# Patient Record
Sex: Female | Born: 2000 | Race: Black or African American | Hispanic: No | Marital: Single | Smoking: Never smoker
Health system: Southern US, Community
[De-identification: ages and names within clinical notes are randomized; demographics above are authoritative.]

---
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
## 2018-08-18 ENCOUNTER — Other Ambulatory Visit: Payer: Self-pay

## 2018-08-18 DIAGNOSIS — Z20828 Contact with and (suspected) exposure to other viral communicable diseases: Secondary | ICD-10-CM | POA: Insufficient documentation

## 2018-08-18 DIAGNOSIS — J029 Acute pharyngitis, unspecified: Secondary | ICD-10-CM | POA: Insufficient documentation

## 2018-08-18 LAB — GROUP A STREP BY PCR: Group A Strep by PCR: NOT DETECTED

## 2018-08-18 MED ORDER — ACETAMINOPHEN 325 MG PO TABS
650.0000 mg | ORAL_TABLET | Freq: Once | ORAL | Status: AC | PRN
  Administered 2018-08-18: 21:00:00 650 mg via ORAL

## 2018-08-18 MED ORDER — ACETAMINOPHEN 325 MG PO TABS
ORAL_TABLET | ORAL | Status: AC
  Filled 2018-08-18: qty 2

## 2018-08-18 NOTE — ED Triage Notes (Signed)
Pt states sore throat that began today. Pt states "I can't swallow". Pt is able to swallow saliva in triage. Pt with red tonsils noted and hoarse voice. Pt denies other COVID symptoms.

## 2018-08-19 ENCOUNTER — Emergency Department
Admission: EM | Admit: 2018-08-19 | Discharge: 2018-08-19 | Disposition: A | Discharge status: Home / Self Care | Payer: Self-pay | Attending: Emergency Medicine | Admitting: Emergency Medicine

## 2018-08-19 DIAGNOSIS — J029 Acute pharyngitis, unspecified: Secondary | ICD-10-CM

## 2018-08-19 LAB — MONONUCLEOSIS SCREEN: Mono Screen: NEGATIVE

## 2018-08-19 MED ORDER — LIDOCAINE VISCOUS HCL 2 % MT SOLN: 5.0000 mL | Freq: Four times a day (QID) | OROMUCOSAL | 0 refills | Status: DC | PRN

## 2018-08-19 MED ORDER — LIDOCAINE VISCOUS HCL 2 % MT SOLN
15.0000 mL | Freq: Once | OROMUCOSAL | Status: AC
  Administered 2018-08-19: 15 mL via OROMUCOSAL
  Filled 2018-08-19: qty 15

## 2018-08-19 MED ORDER — DEXAMETHASONE SODIUM PHOSPHATE 10 MG/ML IJ SOLN
10.0000 mg | Freq: Once | INTRAMUSCULAR | Status: AC
  Administered 2018-08-19: 10 mg via INTRAMUSCULAR
  Filled 2018-08-19: qty 1

## 2018-08-19 MED ORDER — METHYLPREDNISOLONE 4 MG PO TBPK: ORAL_TABLET | ORAL | 0 refills | Status: DC

## 2018-08-19 MED ORDER — DIPHENHYDRAMINE HCL 12.5 MG/5ML PO ELIX
12.5000 mg | ORAL_SOLUTION | Freq: Once | ORAL | Status: AC
  Administered 2018-08-19: 12.5 mg via ORAL
  Filled 2018-08-19: qty 5

## 2018-08-19 NOTE — ED Provider Notes (Signed)
[facility] Emergency Department Provider Note   ____________________________________________   First MD Initiated Contact with Patient 08/19/18 0701     (approximate)  I have reviewed the triage vital signs and the nursing notes.   HISTORY  Chief Complaint Sore Throat    HPI Michelle Monroe is a 18 y.o. female patient presents with sore throat that began earlier today.  Patient states she cannot swallow.  Further history states patient can tolerate fluids.  Patient also has decreased voice volume.  Patient denies URI signs symptoms.  Patient rates the pain as a 10/10.  Patient described pain as "sore".  No palliative measure for complaint.         No past medical history on file.  There are no active problems to display for this patient.     Prior to Admission medications   Medication Sig Start Date End Date Taking? Authorizing Provider  lidocaine (XYLOCAINE) 2 % solution Use as directed 5 mLs in the mouth or throat every 6 (six) hours as needed for mouth pain. 08/19/18   Sable Feil, PA-C  methylPREDNISolone (MEDROL DOSEPAK) 4 MG TBPK tablet Take Tapered dose as directed 08/19/18   Sable Feil, PA-C    Allergies Patient has no known allergies.  No family history on file.  Social History Social History   Tobacco Use  . Smoking status: Not on file  Substance Use Topics  . Alcohol use: Not on file  . Drug use: Not on file    Review of Systems  Constitutional: No fever/chills Eyes: No visual changes. ENT: Sore throat.   Cardiovascular: Denies chest pain. Respiratory: Denies shortness of breath. Gastrointestinal: No abdominal pain.  No nausea, no vomiting.  No diarrhea.  No constipation. Genitourinary: Negative for dysuria. Musculoskeletal: Negative for back pain. Skin: Negative for rash. Neurological: Negative for headaches, focal weakness or numbness.   ____________________________________________   PHYSICAL EXAM:   VITAL SIGNS: ED Triage Vitals  Enc Vitals Group     BP 08/18/18 2122 100/60     Pulse Rate 08/18/18 2122 (!) 102     Resp 08/18/18 2122 20     Temp 08/18/18 2122 (!) 101.7 F (38.7 C)     Temp Source 08/18/18 2122 Oral     SpO2 08/18/18 2122 100 %     Weight 08/18/18 2123 220 lb (99.8 kg)     Height 08/18/18 2123 5\' 2"  (1.575 m)     Head Circumference --      Peak Flow --      Pain Score 08/18/18 2123 10     Pain Loc --      Pain Edu? --      Excl. in [place]? --     Constitutional: Alert and oriented. Well appearing and in no acute distress. Nose: No congestion/rhinnorhea. Mouth/Throat: Mucous membranes are moist.  Oropharynx erythematous.  Edematous tonsils.  No visible exudate. Neck: No stridor.   Hematological/Lymphatic/Immunilogical: No cervical lymphadenopathy. Cardiovascular: Normal rate, regular rhythm. Grossly normal heart sounds.  Good peripheral circulation. Respiratory: Normal respiratory effort.  No retractions. Lungs CTAB. Gastrointestinal: Soft and nontender. No distention. No abdominal bruits. No CVA tenderness. Musculoskeletal: No lower extremity tenderness nor edema.  No joint effusions. Neurologic:  Normal speech and language. No gross focal neurologic deficits are appreciated. No gait instability. Skin:  Skin is warm, dry and intact. No rash noted. Psychiatric: Mood and affect are normal. Speech and behavior are normal.  ____________________________________________   LABS (all  labs ordered are listed, but only abnormal results are displayed)  Labs Reviewed  GROUP A STREP BY PCR  NOVEL CORONAVIRUS, NAA (HOSPITAL ORDER, SEND-OUT TO REF LAB)  MONONUCLEOSIS SCREEN   ____________________________________________  EKG   ____________________________________________  RADIOLOGY  ED MD interpretation:    Official radiology report(s): No results found.  ____________________________________________   PROCEDURES  Procedure(s) performed (including  Critical Care):  Procedures   ____________________________________________   INITIAL IMPRESSION / ASSESSMENT AND PLAN / ED COURSE  As part of my medical decision making, I reviewed the following data within the electronic MEDICAL RECORD NUMBER         Patient presents with acute onset of sore throat.  Differential consist of strep pharyngitis, viral pharyngitis, mononucleosis.  Discussed negative strep results and mono test with patient.  Patient given discharge care instructions and advised take medication as directed.  Patient will be advised to follow coronavirus test is positive.      ____________________________________________   FINAL CLINICAL IMPRESSION(S) / ED DIAGNOSES  Final diagnoses:  Viral pharyngitis     ED Discharge Orders         Ordered    methylPREDNISolone (MEDROL DOSEPAK) 4 MG TBPK tablet     08/19/18 0711    lidocaine (XYLOCAINE) 2 % solution  Every 6 hours PRN     08/19/18 [id number]           Note:  This document was prepared using Dragon voice recognition software and may include unintentional dictation errors.    Joni ReiningSmith, Ronald K, PA-C 08/19/18 [id number]    Jene EveryKinner, Robert, MD 08/19/18 [phone]

## 2018-08-19 NOTE — ED Notes (Signed)
Pt not in lobby.  

## 2018-08-19 NOTE — ED Notes (Signed)
Pt sleeping in lobby 

## 2018-08-19 NOTE — ED Notes (Signed)
Pt up to restroom.

## 2018-08-19 NOTE — ED Notes (Signed)
Pt sleeping in lobby, will obtain COVID and mono screen.

## 2018-08-19 NOTE — ED Notes (Signed)
No answer in lobby.

## 2018-08-20 LAB — NOVEL CORONAVIRUS, NAA (HOSP ORDER, SEND-OUT TO REF LAB; TAT 18-24 HRS): SARS-CoV-2, NAA: NOT DETECTED

## 2018-08-22 ENCOUNTER — Telehealth: Payer: Self-pay | Admitting: Emergency Medicine

## 2018-08-22 NOTE — Telephone Encounter (Signed)
Called patient and informed of negative covid 19 test.  She needs it to return to work.  She would like the result emailed to [email or web address] which I will do.

## 2018-10-04 ENCOUNTER — Other Ambulatory Visit: Payer: Self-pay

## 2018-10-04 ENCOUNTER — Encounter: Payer: Self-pay | Admitting: Emergency Medicine

## 2018-10-04 ENCOUNTER — Emergency Department: Payer: Self-pay

## 2018-10-04 ENCOUNTER — Emergency Department
Admission: EM | Admit: 2018-10-04 | Discharge: 2018-10-04 | Disposition: A | Discharge status: Home / Self Care | Payer: Self-pay | Attending: Student in an Organized Health Care Education/Training Program | Admitting: Student in an Organized Health Care Education/Training Program

## 2018-10-04 DIAGNOSIS — B9789 Other viral agents as the cause of diseases classified elsewhere: Secondary | ICD-10-CM | POA: Insufficient documentation

## 2018-10-04 DIAGNOSIS — J069 Acute upper respiratory infection, unspecified: Secondary | ICD-10-CM | POA: Insufficient documentation

## 2018-10-04 MED ORDER — BENZONATATE 100 MG PO CAPS: 200.0000 mg | ORAL_CAPSULE | Freq: Three times a day (TID) | ORAL | 0 refills | Status: AC | PRN

## 2018-10-04 MED ORDER — IBUPROFEN 800 MG PO TABS: 800.0000 mg | ORAL_TABLET | Freq: Three times a day (TID) | ORAL | 0 refills | Status: DC | PRN

## 2018-10-04 MED ORDER — FEXOFENADINE-PSEUDOEPHED ER 60-120 MG PO TB12: 1.0000 | ORAL_TABLET | Freq: Two times a day (BID) | ORAL | 0 refills | Status: DC

## 2018-10-04 NOTE — ED Notes (Signed)
Pt ambulatory to xray.

## 2018-10-04 NOTE — ED Provider Notes (Signed)
[facility] Emergency Department Provider Note   ____________________________________________   First MD Initiated Contact with Patient 10/04/18 0809     (approximate)  I have reviewed the triage vital signs and the nursing notes.   HISTORY  Chief Complaint Cough    HPI Michelle Monroe is a 18 y.o. female patient presents with productive cough, chest congestion, and fever started yesterday.  Patient denies dyspnea.  Patient denies nausea, vomiting, diarrhea.  Patient denies recent travel.  Patient denies past exposure to human diagnosis COVID-19.  No palliative measure for complaint.         History reviewed. No pertinent past medical history.  There are no active problems to display for this patient.   History reviewed. No pertinent surgical history.  Prior to Admission medications   Medication Sig Start Date End Date Taking? Authorizing Provider  benzonatate (TESSALON PERLES) 100 MG capsule Take 2 capsules (200 mg total) by mouth 3 (three) times daily as needed. 10/04/18 10/04/19  Sable Feil, PA-C  fexofenadine-pseudoephedrine (ALLEGRA-D) 60-120 MG 12 hr tablet Take 1 tablet by mouth 2 (two) times daily. 10/04/18   Sable Feil, PA-C  ibuprofen (ADVIL) 800 MG tablet Take 1 tablet (800 mg total) by mouth every 8 (eight) hours as needed for moderate pain. 10/04/18   Sable Feil, PA-C  lidocaine (XYLOCAINE) 2 % solution Use as directed 5 mLs in the mouth or throat every 6 (six) hours as needed for mouth pain. 08/19/18   Sable Feil, PA-C  methylPREDNISolone (MEDROL DOSEPAK) 4 MG TBPK tablet Take Tapered dose as directed 08/19/18   Sable Feil, PA-C    Allergies Patient has no known allergies.  No family history on file.  Social History Social History   Tobacco Use  . Smoking status: Never Smoker  . Smokeless tobacco: Never Used  Substance Use Topics  . Alcohol use: Not on file  . Drug use: Not on file    Review of Systems  Constitutional: Fever and body aches.   Eyes: No visual changes. ENT: No sore throat. Cardiovascular: Denies chest pain. Respiratory: Productive cough. Gastrointestinal: No abdominal pain.  No nausea, no vomiting.  No diarrhea.  No constipation. Genitourinary: Negative for dysuria. Musculoskeletal: Negative for back pain. Skin: Negative for rash. Neurological: Negative for headaches, focal weakness or numbness.   ____________________________________________   PHYSICAL EXAM:  VITAL SIGNS: ED Triage Vitals  Enc Vitals Group     BP 10/04/18 0757 (!) 172/138     Pulse Rate 10/04/18 0757 82     Resp 10/04/18 0757 18     Temp 10/04/18 0757 100.2 F (37.9 C)     Temp Source 10/04/18 0757 Oral     SpO2 10/04/18 0757 94 %     Weight 10/04/18 0800 253 lb (114.8 kg)     Height 10/04/18 0800 5\' 2"  (1.575 m)     Head Circumference --      Peak Flow --      Pain Score 10/04/18 0801 0     Pain Loc --      Pain Edu? --      Excl. in [place]? --     Constitutional: Alert and oriented. Well appearing and in no acute distress. Nose: No congestion/rhinnorhea. Mouth/Throat: Mucous membranes are moist.  Oropharynx non-erythematous. Neck: No stridor.  Hematological/Lymphatic/Immunilogical: No cervical lymphadenopathy. Cardiovascular: Normal rate, regular rhythm. Grossly normal heart sounds.  Good peripheral circulation.  Elevated blood pressure.  Due to no  history of hypertension we will retake blood pressure. Respiratory: Normal respiratory effort.  No retractions. Lungs CTAB. Neurologic:  Normal speech and language. No gross focal neurologic deficits are appreciated. No gait instability. Skin:  Skin is warm, dry and intact. No rash noted. Psychiatric: Mood and affect are normal. Speech and behavior are normal.  ____________________________________________   LABS (all labs ordered are listed, but only abnormal results are displayed)  Labs Reviewed - No data to display  ____________________________________________  EKG   ____________________________________________  RADIOLOGY  ED MD interpretation:    Official radiology report(s): Dg Chest 2 View  Result Date: 10/04/2018 CLINICAL DATA:  Cough and fever EXAM: CHEST - 2 VIEW COMPARISON:  None. FINDINGS: Lungs are clear. Heart size and pulmonary vascularity are normal. No adenopathy. 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 IMPRESSION: No edema or consolidation. Electronically Signed   By: Bretta BangWilliam  Woodruff III M.D.   On: 10/04/2018 08:47    ____________________________________________   PROCEDURES  Procedure(s) performed (including Critical Care):  Procedures   ____________________________________________   INITIAL IMPRESSION / ASSESSMENT AND PLAN / ED COURSE  As part of my medical decision making, I reviewed the following data within the electronic MEDICAL RECORD NUMBER         Michelle Monroe was evaluated in Emergency Department on 10/04/2018 for the symptoms described in the history of present illness. She was evaluated in the context of the global COVID-19 pandemic, which necessitated consideration that the patient might be at risk for infection with the SARS-CoV-2 virus that causes COVID-19. Institutional protocols and algorithms that pertain to the evaluation of patients at risk for COVID-19 are in a state of rapid change based on information released by regulatory bodies including the CDC and federal and state organizations. These policies and algorithms were followed during the patient's care in the ED.    Patient presents for fever and productive cough for 2 days.  Feels exam is consistent with viral respiratory infection.  Discussed neck x-ray findings with patient.  Patient given discharge care instruction work note.  Patient advised take medication as directed and [facility].   ____________________________________________   FINAL CLINICAL IMPRESSION(S) / ED DIAGNOSES   Final diagnoses:  Viral URI with cough     ED Discharge Orders         Ordered    benzonatate (TESSALON PERLES) 100 MG capsule  3 times daily PRN     10/04/18 0857    fexofenadine-pseudoephedrine (ALLEGRA-D) 60-120 MG 12 hr tablet  2 times daily     10/04/18 0857    ibuprofen (ADVIL) 800 MG tablet  Every 8 hours PRN     10/04/18 0857           Note:  This document was prepared using Dragon voice recognition software and may include unintentional dictation errors.    Joni ReiningSmith, [place] K, PA-C 10/04/18 [id number]    Sharman CheekStafford, Phillip, MD 10/14/18 1538

## 2018-10-04 NOTE — ED Triage Notes (Signed)
Patient presents to the ED with congestion, cough and fever since yesterday.  Patient is in no obvious distress at this time.

## 2018-10-04 NOTE — ED Notes (Signed)
Pt returned from xray, ambulatory with steady gait.

## 2018-10-04 NOTE — ED Notes (Signed)
PA at bedside.

## 2019-09-14 ENCOUNTER — Emergency Department
Admission: EM | Admit: 2019-09-14 | Discharge: 2019-09-14 | Discharge status: Against Medical Advice | Payer: Medicaid Other

## 2019-09-14 NOTE — ED Notes (Signed)
Called x2, no answer 

## 2019-09-14 NOTE — ED Notes (Signed)
Called 1X no answer 

## 2019-09-14 NOTE — ED Notes (Signed)
Called x 3 no answer 

## 2019-09-14 NOTE — ED Notes (Signed)
Patient up to stat inquiring why she is still waiting.  Explained to patient that she had been called 3 times for triage over 15 minutes and staff did not get an answer so she was removed from system.  Explained to patient we would be happy to put her back into the system and get her seen.  Patient observed walking out of ED.

## 2019-11-10 ENCOUNTER — Other Ambulatory Visit: Payer: Self-pay

## 2019-11-10 ENCOUNTER — Emergency Department
Admission: EM | Admit: 2019-11-10 | Discharge: 2019-11-10 | Disposition: A | Discharge status: Home / Self Care | Payer: HRSA Program | Attending: Emergency Medicine | Admitting: Emergency Medicine

## 2019-11-10 ENCOUNTER — Encounter: Payer: Self-pay | Admitting: Emergency Medicine

## 2019-11-10 DIAGNOSIS — R55 Syncope and collapse: Secondary | ICD-10-CM | POA: Diagnosis present

## 2019-11-10 DIAGNOSIS — U071 COVID-19: Secondary | ICD-10-CM

## 2019-11-10 DIAGNOSIS — Z79899 Other long term (current) drug therapy: Secondary | ICD-10-CM | POA: Diagnosis not present

## 2019-11-10 DIAGNOSIS — N3001 Acute cystitis with hematuria: Secondary | ICD-10-CM | POA: Insufficient documentation

## 2019-11-10 LAB — URINALYSIS, COMPLETE (UACMP) WITH MICROSCOPIC
Bacteria, UA: NONE SEEN
Bilirubin Urine: NEGATIVE
Glucose, UA: NEGATIVE mg/dL
Ketones, ur: 80 mg/dL — AB
Nitrite: NEGATIVE
Protein, ur: 100 mg/dL — AB
RBC / HPF: >50 RBC/hpf — ABNORMAL HIGH (ref 0–5)
Specific Gravity, Urine: 1.032 — ABNORMAL HIGH (ref 1.005–1.030)
pH: 5 (ref 5.0–8.0)

## 2019-11-10 LAB — RESPIRATORY PANEL BY RT PCR (FLU A&B, COVID)
Influenza A by PCR: NEGATIVE
Influenza B by PCR: NEGATIVE
SARS Coronavirus 2 by RT PCR: POSITIVE — AB

## 2019-11-10 LAB — PREGNANCY, URINE: Preg Test, Ur: NEGATIVE

## 2019-11-10 MED ORDER — CEPHALEXIN 500 MG PO CAPS: 500.0000 mg | ORAL_CAPSULE | Freq: Three times a day (TID) | ORAL | 0 refills | Status: AC

## 2019-11-10 MED ORDER — ACETAMINOPHEN 500 MG PO TABS
1000.0000 mg | ORAL_TABLET | Freq: Once | ORAL | Status: AC
  Administered 2019-11-10: 1000 mg via ORAL
  Filled 2019-11-10: qty 2

## 2019-11-10 NOTE — ED Provider Notes (Signed)
[facility] Emergency Department Provider Note  ____________________________________________   First MD Initiated Contact with Patient 11/10/19 1842     (approximate)  I have reviewed the triage vital signs and the nursing notes.   HISTORY  Chief Complaint Nasal Congestion and Generalized Body Aches   HPI Michelle Monroe is a 19 y.o. female who presents to the emergency department for evaluation of presyncopal episode that occurred at work.  The patient states that she has felt unwell for the last approximately 3 days with body aches, fever and abdominal pain.  She had one episode of diarrhea yesterday and one episode of vomiting today.  She was at work at [employer] and felt dizzy and lightheaded and someone sat her down in a chair and called EMS.  She also is unsure if she is pregnant.  LMP was the beginning of August and she is greater than 1 week late.  She denies dysuria but states that toilet paper has been pink-tinged when wiping.  She denies nasal congestion, cough.       History reviewed. No pertinent past medical history.  There are no problems to display for this patient.   History reviewed. No pertinent surgical history.  Prior to Admission medications   Medication Sig Start Date End Date Taking? Authorizing Provider  cephALEXin (KEFLEX) 500 MG capsule Take 1 capsule (500 mg total) by mouth 3 (three) times daily for 10 days. 11/10/19 11/20/19  Lucy Chris, PA  fexofenadine-pseudoephedrine (ALLEGRA-D) 60-120 MG 12 hr tablet Take 1 tablet by mouth 2 (two) times daily. 10/04/18   Joni Reining, PA-C  ibuprofen (ADVIL) 800 MG tablet Take 1 tablet (800 mg total) by mouth every 8 (eight) hours as needed for moderate pain. 10/04/18   Joni Reining, PA-C  lidocaine (XYLOCAINE) 2 % solution Use as directed 5 mLs in the mouth or throat every 6 (six) hours as needed for mouth pain. 08/19/18   Joni Reining, PA-C  methylPREDNISolone (MEDROL DOSEPAK) 4  MG TBPK tablet Take Tapered dose as directed 08/19/18   Joni Reining, PA-C    Allergies Patient has no known allergies.  No family history on file.  Social History Social History   Tobacco Use  . Smoking status: Never Smoker  . Smokeless tobacco: Never Used  Substance Use Topics  . Alcohol use: Not on file  . Drug use: Not on file    Review of Systems Constitutional: + fever/chills, + body aches, + malaise Eyes: No visual changes. ENT: No sore throat. Cardiovascular: Denies chest pain. Respiratory: Denies shortness of breath. Gastrointestinal: +  abdominal pain.  + nausea, + vomiting. + diarrhea.  No constipation. Genitourinary: Negative for dysuria. Musculoskeletal: Negative for back pain. Skin: Negative for rash. Neurological: + headaches, negative for focal weakness or numbness. ____________________________________________   PHYSICAL EXAM:  VITAL SIGNS: ED Triage Vitals  Enc Vitals Group     BP 11/10/19 1559 124/74     Pulse Rate 11/10/19 1559 97     Resp 11/10/19 1559 20     Temp 11/10/19 1559 (!) 101.4 F (38.6 C)     Temp Source 11/10/19 1559 Oral     SpO2 11/10/19 1559 97 %     Weight 11/10/19 1601 210 lb (95.3 kg)     Height 11/10/19 1601 5\' 3"  (1.6 m)     Head Circumference --      Peak Flow --      Pain Score 11/10/19 1601 10  Pain Loc --      Pain Edu? --      Excl. in [place]? --     Constitutional: Alert and oriented. Well appearing and in no acute distress. Eyes: Conjunctivae are normal. PERRL. EOMI. Head: Atraumatic. Nose: No congestion/rhinnorhea. Mouth/Throat: Mucous membranes are moist.  Oropharynx non-erythematous. Neck: No stridor.   Lymphatic: Tender cervical lymphadenopathy  cardiovascular: Normal rate, regular rhythm. Grossly normal heart sounds.  Good peripheral circulation. Respiratory: Normal respiratory effort.  No retractions. Lungs CTAB. Gastrointestinal: Soft.  There is suprapubic tenderness and mild left lower quadrant  tenderness. No distention. No abdominal bruits. No CVA tenderness. Musculoskeletal: No lower extremity tenderness nor edema.  No joint effusions. Neurologic:  Normal speech and language. No gross focal neurologic deficits are appreciated. No gait instability. Skin:  Skin is warm, dry and intact. No rash noted. Psychiatric: Mood and affect are normal. Speech and behavior are normal.  ____________________________________________   LABS (all labs ordered are listed, but only abnormal results are displayed)  Labs Reviewed  RESPIRATORY PANEL BY RT PCR (FLU A&B, COVID) - Abnormal; Notable for the following components:      Result Value   SARS Coronavirus 2 by RT PCR POSITIVE (*)    All other components within normal limits  URINALYSIS, COMPLETE (UACMP) WITH MICROSCOPIC - Abnormal; Notable for the following components:   Color, Urine AMBER (*)    APPearance CLOUDY (*)    Specific Gravity, Urine 1.032 (*)    Hgb urine dipstick LARGE (*)    Ketones, ur 80 (*)    Protein, ur 100 (*)    Leukocytes,Ua TRACE (*)    RBC / HPF >50 (*)    All other components within normal limits  URINE CULTURE  PREGNANCY, URINE    ____________________________________________   INITIAL IMPRESSION / ASSESSMENT AND PLAN / ED COURSE  As part of my medical decision making, I reviewed the following data within the electronic MEDICAL RECORD NUMBER Nursing notes reviewed and incorporated and Notes from prior ED visits        Michelle Monroe is a 19 year old female who presents to the emergency department for evaluation of near syncopal episode accompanied by abdominal pain, headache and one episode each of vomiting and diarrhea.  Exam is significant for cervical lymphadenopathy, suprapubic abdominal pain.  Patient denies dysuria and there is no CVA tenderness.  Urine pregnancy is negative and urinalysis demonstrates large amount of microscopic hemoglobin as well as greater than 50 RBCs and trace leukocytes.  Is unclear if  this is related to the patient being near time of menstruation versus acute cystitis versus evidence of renal stone.  Given the suprapubic pain and no CVA tenderness, fever this to likely be acute cystitis.  The patient was also tested for Covid secondary to near syncopal episode and febrile illness for which she was positive.  She was advised that she should quarantine for the next 2 weeks.  She will placed on antibiotic for acute cystitis and was encouraged to take Tylenol rotated with ibuprofen for her fever.  She should return to the emergency department for any shortness of breath, chest pain or worsening of symptoms despite treatment.      ____________________________________________   FINAL CLINICAL IMPRESSION(S) / ED DIAGNOSES  Final diagnoses:  COVID-19  Acute cystitis with hematuria     ED Discharge Orders         Ordered    cephALEXin (KEFLEX) 500 MG capsule  3 times daily  11/10/19 2018          *Please note:  Rayford Halsted was evaluated in Emergency Department on 11/10/2019 for the symptoms described in the history of present illness. She was evaluated in the context of the global COVID-19 pandemic, which necessitated consideration that the patient might be at risk for infection with the SARS-CoV-2 virus that causes COVID-19. Institutional protocols and algorithms that pertain to the evaluation of patients at risk for COVID-19 are in a state of rapid change based on information released by regulatory bodies including the CDC and federal and state organizations. These policies and algorithms were followed during the patient's care in the ED.  Some ED evaluations and interventions may be delayed as a result of limited staffing during and the pandemic.*   Note:  This document was prepared using Dragon voice recognition software and may include unintentional dictation errors.    Lucy Chris, PA 11/10/19 2305    Gilles Chiquito, MD 11/11/19 1144

## 2019-11-10 NOTE — ED Triage Notes (Signed)
Patient presents to the ED with fever, tiredness, body aches and loss of taste.  Patient states she may be pregnant and originally thought that's why she has been so tired lately.  Patient denies taking a pregnancy test.

## 2019-11-10 NOTE — ED Triage Notes (Signed)
Pt in via EMS from work with COVID sx's. 99% RA,

## 2019-11-11 ENCOUNTER — Telehealth (HOSPITAL_COMMUNITY): Payer: Self-pay | Admitting: Nurse Practitioner

## 2019-11-11 DIAGNOSIS — U071 COVID-19: Secondary | ICD-10-CM

## 2019-11-11 NOTE — Telephone Encounter (Signed)
Called to Discuss with patient about Covid symptoms and the use of regeneron, a monoclonal antibody infusion for those with mild to moderate Covid symptoms and at a high risk of hospitalization.     Pt is qualified for this infusion at the [facility] infusion center due to co-morbid conditions and/or a member of an at-risk group.     Unable to reach pt. VM not available. No mychart. Sent text.   Consuello Masse, DNP, AGNP-C [phone] (Infusion Center Hotline)

## 2019-11-12 LAB — URINE CULTURE

## 2020-08-04 ENCOUNTER — Encounter: Payer: Self-pay | Admitting: Emergency Medicine

## 2020-08-04 ENCOUNTER — Other Ambulatory Visit: Payer: Self-pay

## 2020-08-04 ENCOUNTER — Emergency Department
Admission: EM | Admit: 2020-08-04 | Discharge: 2020-08-04 | Disposition: A | Discharge status: Home / Self Care | Payer: Medicaid Other | Attending: Emergency Medicine | Admitting: Emergency Medicine

## 2020-08-04 DIAGNOSIS — Z3A00 Weeks of gestation of pregnancy not specified: Secondary | ICD-10-CM | POA: Insufficient documentation

## 2020-08-04 DIAGNOSIS — Z3201 Encounter for pregnancy test, result positive: Secondary | ICD-10-CM | POA: Insufficient documentation

## 2020-08-04 DIAGNOSIS — Z3490 Encounter for supervision of normal pregnancy, unspecified, unspecified trimester: Secondary | ICD-10-CM

## 2020-08-04 DIAGNOSIS — O26891 Other specified pregnancy related conditions, first trimester: Secondary | ICD-10-CM | POA: Insufficient documentation

## 2020-08-04 LAB — POC URINE PREG, ED: Preg Test, Ur: POSITIVE — AB

## 2020-08-04 MED ORDER — ONDANSETRON 4 MG PO TBDP: 4.0000 mg | ORAL_TABLET | Freq: Three times a day (TID) | ORAL | 0 refills | Status: DC | PRN

## 2020-08-04 NOTE — ED Provider Notes (Signed)
[facility] Emergency Department Provider Note  ____________________________________________  Time seen: Approximately 8:39 PM  I have reviewed the triage vital signs and the nursing notes.   HISTORY  Chief Complaint Possible Pregnancy    HPI Michelle Monroe is a 20 y.o. female who presents the emergency department for evaluation of possible pregnancy.  She states that her menstrual cycle is more than a month late.  She took a home pregnancy test and was positive.  She states that she has having bilateral breast tenderness, morning nausea, some cramping at nighttime.  No vaginal bleeding or discharge.  No dysuria, polyuria, hematuria.  No fevers or chills.  This would be patient's first pregnancy.  Patient is looking for confirmation whether she is pregnant or not at this time.         History reviewed. No pertinent past medical history.  There are no problems to display for this patient.   History reviewed. No pertinent surgical history.  Prior to Admission medications   Medication Sig Start Date End Date Taking? Authorizing Provider  ondansetron (ZOFRAN-ODT) 4 MG disintegrating tablet Take 1 tablet (4 mg total) by mouth every 8 (eight) hours as needed for nausea or vomiting. 08/04/20  Yes Nila Winker, Delorise Royals, PA-C  fexofenadine-pseudoephedrine (ALLEGRA-D) 60-120 MG 12 hr tablet Take 1 tablet by mouth 2 (two) times daily. 10/04/18   Joni Reining, PA-C  ibuprofen (ADVIL) 800 MG tablet Take 1 tablet (800 mg total) by mouth every 8 (eight) hours as needed for moderate pain. 10/04/18   Joni Reining, PA-C  lidocaine (XYLOCAINE) 2 % solution Use as directed 5 mLs in the mouth or throat every 6 (six) hours as needed for mouth pain. 08/19/18   Joni Reining, PA-C  methylPREDNISolone (MEDROL DOSEPAK) 4 MG TBPK tablet Take Tapered dose as directed 08/19/18   Joni Reining, PA-C    Allergies Patient has no known allergies.  History reviewed. No pertinent  family history.  Social History Social History   Tobacco Use  . Smoking status: Never Smoker  . Smokeless tobacco: Never Used  Substance Use Topics  . Drug use: Yes    Types: Marijuana     Review of Systems  Constitutional: No fever/chills Eyes: No visual changes. No discharge ENT: No upper respiratory complaints. Cardiovascular: no chest pain. Respiratory: no cough. No SOB. Gastrointestinal: No abdominal pain.  No nausea, no vomiting.  No diarrhea.  No constipation. Genitourinary: Negative for dysuria. No hematuria.  Patient is a limp plus late on her menstrual cycle. Musculoskeletal: Negative for musculoskeletal pain. Skin: Negative for rash, abrasions, lacerations, ecchymosis. Neurological: Negative for headaches, focal weakness or numbness.  10 System ROS otherwise negative.  ____________________________________________   PHYSICAL EXAM:  VITAL SIGNS: ED Triage Vitals  Enc Vitals Group     BP 08/04/20 1846 (!) 144/95     Pulse Rate 08/04/20 1846 82     Resp 08/04/20 1846 18     Temp 08/04/20 1846 98.5 F (36.9 C)     Temp Source 08/04/20 1846 Oral     SpO2 08/04/20 1846 100 %     Weight 08/04/20 1847 250 lb (113.4 kg)     Height 08/04/20 1847 5\' 2"  (1.575 m)     Head Circumference --      Peak Flow --      Pain Score 08/04/20 1846 0     Pain Loc --      Pain Edu? --  Excl. in GC? --      Constitutional: Alert and oriented. Well appearing and in no acute distress. Eyes: Conjunctivae are normal. PERRL. EOMI. Head: Atraumatic. ENT:      Ears:       Nose: No congestion/rhinnorhea.      Mouth/Throat: Mucous membranes are moist.  Neck: No stridor.    Cardiovascular: Normal rate, regular rhythm. Normal S1 and S2.  Good peripheral circulation. Respiratory: Normal respiratory effort without tachypnea or retractions. Lungs CTAB. Good air entry to the bases with no decreased or absent breath sounds. Gastrointestinal: Bowel sounds 4 quadrants. Soft and  nontender to palpation. No guarding or rigidity. No palpable masses. No distention. No CVA tenderness. Musculoskeletal: Full range of motion to all extremities. No gross deformities appreciated. Neurologic:  Normal speech and language. No gross focal neurologic deficits are appreciated.  Skin:  Skin is warm, dry and intact. No rash noted. Psychiatric: Mood and affect are normal. Speech and behavior are normal. Patient exhibits appropriate insight and judgement.   ____________________________________________   LABS (all labs ordered are listed, but only abnormal results are displayed)  Labs Reviewed  POC URINE PREG, ED - Abnormal; Notable for the following components:      Result Value   Preg Test, Ur Positive (*)    All other components within normal limits   ____________________________________________  EKG   ____________________________________________  RADIOLOGY   No results found.  ____________________________________________    PROCEDURES  Procedure(s) performed:    Procedures    Medications - No data to display   ____________________________________________   INITIAL IMPRESSION / ASSESSMENT AND PLAN / ED COURSE  Pertinent labs & imaging results that were available during my care of the patient were reviewed by me and considered in my medical decision making (see chart for details).  Review of the [place] CSRS was performed in accordance of the [facility] prior to dispensing any controlled drugs.           Patient's diagnosis is consistent with pregnant.  Patient presented to the emergency department for confirmation of pregnancy.  She states that she had missed her menstrual cycle last month and was having some nausea, breast tenderness, intermittent cramps.  Patient had taken home pregnancy test and was positive and she would like to confirm.  Physical exam is reassuring.  There was no acute findings at this time.  Positive pregnancy test here in the emergency  department.  No indication for further labs or imaging currently.  She is referred to OB/GYN to establish care.  She is not encouraged to use prenatal vitamins and I will prescribe medication for nausea.  Patient will follow-up with OB/GYN at this time.  Concerning signs and symptoms and return precautions discussed with the patient at this time. Patient is given ED precautions to return to the ED for any worsening or new symptoms.     ____________________________________________  FINAL CLINICAL IMPRESSION(S) / ED DIAGNOSES  Final diagnoses:  Pregnancy, unspecified gestational age      NEW MEDICATIONS STARTED DURING THIS VISIT:  ED Discharge Orders         Ordered    ondansetron (ZOFRAN-ODT) 4 MG disintegrating tablet  Every 8 hours PRN        08/04/20 2154              This chart was dictated using voice recognition software/Dragon. Despite best efforts to proofread, errors can occur which can change the meaning. Any change was purely unintentional.  Lanette Hampshire 08/05/20 Raymondo Band, MD 08/05/20 [phone]

## 2020-08-04 NOTE — ED Triage Notes (Signed)
Pt comes into the ED via POV c/o possible pregnancy.  Pt states LMP was 05/24/20.  Pt has one positive test at home but wants to confirm here.  Pt denies any vaginal bleeding, but confirm breast tenderness.

## 2020-08-27 ENCOUNTER — Other Ambulatory Visit (HOSPITAL_COMMUNITY)
Admission: RE | Admit: 2020-08-27 | Discharge: 2020-08-27 | Disposition: A | Discharge status: Home / Self Care | Payer: Medicaid Other | Source: Ambulatory Visit | Attending: Obstetrics and Gynecology | Admitting: Obstetrics and Gynecology

## 2020-08-27 ENCOUNTER — Other Ambulatory Visit: Payer: Self-pay

## 2020-08-27 ENCOUNTER — Ambulatory Visit (INDEPENDENT_AMBULATORY_CARE_PROVIDER_SITE_OTHER): Payer: Medicaid Other | Admitting: Obstetrics and Gynecology

## 2020-08-27 ENCOUNTER — Encounter: Payer: Self-pay | Admitting: Obstetrics and Gynecology

## 2020-08-27 VITALS — BP 108/70 | Ht 63.0 in | Wt 270.6 lb

## 2020-08-27 DIAGNOSIS — Z130 Encounter for screening for diseases of the blood and blood-forming organs and certain disorders involving the immune mechanism: Secondary | ICD-10-CM | POA: Diagnosis not present

## 2020-08-27 DIAGNOSIS — Z124 Encounter for screening for malignant neoplasm of cervix: Secondary | ICD-10-CM | POA: Diagnosis not present

## 2020-08-27 DIAGNOSIS — O99210 Obesity complicating pregnancy, unspecified trimester: Secondary | ICD-10-CM

## 2020-08-27 DIAGNOSIS — Z3401 Encounter for supervision of normal first pregnancy, first trimester: Secondary | ICD-10-CM | POA: Insufficient documentation

## 2020-08-27 DIAGNOSIS — N912 Amenorrhea, unspecified: Secondary | ICD-10-CM

## 2020-08-27 NOTE — Patient Instructions (Signed)
Obstetrics: Normal and Problem Pregnancies (7th ed., pp. 102-121). [place], [place]: Elsevier."> Textbook of Family Medicine (9th ed., pp. 365-410). [place], [place]: Else[employer].">  First Trimester of Pregnancy  The first trimester of pregnancy starts on the first day of your last menstrual period until the end of week 12. This is months 1 through 3 of pregnancy. A week after a sperm fertilizes an egg, the egg will implant into the wall of the uterus and begin to develop into a baby. By the end of 12 weeks, all the baby'sorgans will be formed and the baby will be 2-3 inches in size. Body changes during your first trimester Your body goes through many changes during pregnancy. The changes vary andgenerally return to normal after your baby is born. Physical changes You may gain or lose weight. Your breasts may begin to grow larger and become tender. The tissue that surrounds your nipples (areola) may become darker. Dark spots or blotches (chloasma or mask of pregnancy) may develop on your face. You may have changes in your hair. These can include thickening or thinning of your hair or changes in texture. Health changes You may feel nauseous, and you may vomit. You may have heartburn. You may develop headaches. You may develop constipation. Your gums may bleed and may be sensitive to brushing and flossing. Other changes You may tire easily. You may urinate more often. Your menstrual periods will stop. You may have a loss of appetite. You may develop cravings for certain kinds of food. You may have changes in your emotions from day to day. You may have more vivid and strange dreams. Follow these instructions at home: Medicines Follow your health care provider's instructions regarding medicine use. Specific medicines may be either safe or unsafe to take during pregnancy. Do not take any medicines unless told to by your health care provider. Take a prenatal vitamin that contains at least  600 micrograms (mcg) of folic acid. Eating and drinking Eat a healthy diet that includes fresh fruits and vegetables, whole grains, good sources of protein such as meat, eggs, or tofu, and low-fat dairy products. Avoid raw meat and unpasteurized juice, milk, and cheese. These carry germs that can harm you and your baby. If you feel nauseous or you vomit: Eat 4 or 5 small meals a day instead of 3 large meals. Try eating a few soda crackers. Drink liquids between meals instead of during meals. You may need to take these actions to prevent or treat constipation: Drink enough fluid to keep your urine pale yellow. Eat foods that are high in fiber, such as beans, whole grains, and fresh fruits and vegetables. Limit foods that are high in fat and processed sugars, such as fried or sweet foods. Activity Exercise only as directed by your health care provider. Most people can continue their usual exercise routine during pregnancy. Try to exercise for 30 minutes at least 5 days a week. Stop exercising if you develop pain or cramping in the lower abdomen or lower back. Avoid exercising if it is very hot or humid or if you are at high altitude. Avoid heavy lifting. If you choose to, you may have sex unless your health care provider tells you not to. Relieving pain and discomfort Wear a good support bra to relieve breast tenderness. Rest with your legs elevated if you have leg cramps or low back pain. If you develop bulging veins (varicose veins) in your legs: Wear support hose as told by your health care provider. Elevate   your feet for 15 minutes, 3-4 times a day. Limit salt in your diet. Safety Wear your seat belt at all times when driving or riding in a car. Talk with your health care provider if someone is verbally or physically abusive to you. Talk with your health care provider if you are feeling sad or have thoughts of hurting yourself. Lifestyle Do not use hot tubs, steam rooms, or  saunas. Do not douche. Do not use tampons or scented sanitary pads. Do not use herbal remedies, alcohol, illegal drugs, or medicines that are not approved by your health care provider. Chemicals in these products can harm your baby. Do not use any products that contain nicotine or tobacco, such as cigarettes, e-cigarettes, and chewing tobacco. If you need help quitting, ask your health care provider. Avoid cat litter boxes and soil used by cats. These carry germs that can cause birth defects in the baby and possibly loss of the unborn baby (fetus) by miscarriage or stillbirth. General instructions During routine prenatal visits in the first trimester, your health care provider will do a physical exam, perform necessary tests, and ask you how things are going. Keep all follow-up visits. This is important. Ask for help if you have counseling or nutritional needs during pregnancy. Your health care provider can offer advice or refer you to specialists for help with various needs. Schedule a dentist appointment. At home, brush your teeth with a soft toothbrush. Floss gently. Write down your questions. Take them to your prenatal visits. Where to find more information American Pregnancy Association: americanpregnancy.org [employer]: https://www.todd-brady.net/ Office on [employer] Health: [employer] Contact a health care provider if you have: Dizziness. A fever. Mild pelvic cramps, pelvic pressure, or nagging pain in the abdominal area. Nausea, vomiting, or diarrhea that lasts for 24 hours or longer. A bad-smelling vaginal discharge. Pain when you urinate. Known exposure to a contagious illness, such as chickenpox, measles, Zika virus, HIV, or hepatitis. Get help right away if you have: Spotting or bleeding from your vagina. Severe abdominal cramping or pain. Shortness of breath or chest pain. Any kind of trauma, such as from a fall  or a car crash. New or increased pain, swelling, or redness in an arm or leg. Summary The first trimester of pregnancy starts on the first day of your last menstrual period until the end of week 12 (months 1 through 3). Eating 4 or 5 small meals a day rather than 3 large meals may help to relieve nausea and vomiting. Do not use any products that contain nicotine or tobacco, such as cigarettes, e-cigarettes, and chewing tobacco. If you need help quitting, ask your health care provider. Keep all follow-up visits. This is important. This information is not intended to replace advice given to you by your health care provider. Make sure you discuss any questions you have with your healthcare provider. Document Revised: 07/23/2019 Document Reviewed: 05/29/2019 Elsevier Patient Education  2022 Elsevier Inc. Hyperemesis Gravidarum Hyperemesis gravidarum is a severe form of nausea and vomiting that happens during pregnancy. Hyperemesis is worse than morning sickness. It may cause you to have nausea or vomiting all day for many days. It may keep you from eating and drinking enough food and liquids, which can lead to dehydration, malnutrition, and weight loss. Hyperemesis usually occurs during the first half (the first 20 weeks) of pregnancy. It often goes away once a woman is in her second half of pregnancy. However, sometimes hyperemesis continues through anentire pregnancy. What  are the causes? The cause of this condition is not known. It may be associated with: Changes in hormones in the body during pregnancy. Changes in the gastrointestinal system. Genetic or inherited conditions. What are the signs or symptoms? Symptoms of this condition include: Severe nausea and vomiting that does not go away. Problems keeping food down. Weight loss. Loss of body fluid (dehydration). Loss of appetite. You may have no desire to eat or you may not like the food you have previously enjoyed. How is this  diagnosed? This condition may be diagnosed based on your medical history, your symptoms,and a physical exam. You may also have other tests, including: Blood tests. Urine tests. Blood pressure tests. Ultrasound to look for problems with the placenta or to check if you are pregnant with more than one baby. How is this treated? This condition is managed by controlling symptoms. This may include: Following an eating plan. This can help to lessen nausea and vomiting. Treatments that do not use medicine. These include acupressure bracelets, hypnosis, and eating or drinking foods or fluids that contain ginger, ginger ale, or ginger tea. Taking prescription medicine or over-the-counter medicine as told by your health care provider. Continuing to take prenatal vitamins. You may need to change what kind you take and when you take them. Follow your health care provider's instructions about prenatal vitamins. An eating plan and medicines are often used together to help control symptoms. If medicines do not help relieve nausea and vomiting, you may need to receivefluids through an IV at the hospital. Follow these instructions at home: To help relieve your symptoms, listen to your body. Everyone is different and has different preferences. Find what works best for you. Here are some thingsyou can try to help relieve your symptoms: Meals and snacks  Eat 5-6 small meals daily instead of 3 large meals. Eating small meals and snacks can help you avoid an empty stomach. Before getting out of bed, eat a couple of crackers to avoid moving around on an empty stomach. Eat a protein-rich snack before bed. Examples include cheese and crackers, or a peanut butter sandwich made with 1 slice of whole-wheat bread and 1 tsp (5 g) of peanut butter. Eat and drink slowly. Try eating starchy foods as these are usually tolerated well. Examples include cereal, toast, bread, potatoes, pasta, rice, and pretzels. Eat at least one  serving of protein with your meals and snacks. Protein options include lean meats, poultry, seafood, beans, nuts, nut butters, eggs, cheese, and yogurt. Eat or suck on things that have ginger in them. It may help to relieve nausea. Add  tsp (0.44 g) ground ginger to hot tea, or choose ginger tea.  Fluids It is important to stay hydrated. Try to: Drink small amounts of fluids often. Drink fluids 30 minutes before or after a meal to help lessen the feeling of a full stomach. Drink 100% fruit juice or an electrolyte drink. An electrolyte drink contains sodium, potassium, and chloride. Drink fluids that are cold, clear, and carbonated or sour. These include lemonade, ginger ale, lemon-lime soda, ice water, and sparkling water. Things to avoid Avoid the following: Eating foods that trigger your symptoms. These may include spicy foods, coffee, high-fat foods, very sweet foods, and acidic foods. Drinking more than 1 cup of fluid at a time. Skipping meals. Nausea can be more intense on an empty stomach. If you cannot tolerate food, do not force it. Try sucking on ice chips or other frozen items and make up  for missed calories later. Lying down within 2 hours after eating. Being exposed to environmental triggers. These may include food smells, smoky rooms, closed spaces, rooms with strong smells, warm or humid places, overly loud and noisy rooms, and rooms with motion or flickering lights. Try eating meals in a well-ventilated area that is free of strong smells. Making quick and sudden changes in your movement. Taking iron pills and multivitamins that contain iron. If you take prescription iron pills, do not stop taking them unless your health care provider approves. Preparing food. The smell of food can spoil your appetite or trigger nausea. General instructions Brush your teeth or use a mouth rinse after meals. Take over-the-counter and prescription medicines only as told by your health care  provider. Follow instructions from your health care provider about eating or drinking restrictions. Talk with your health care provider about starting a supplement of vitamin B6. Continue to take your prenatal vitamins as told by your health care provider. If you are having trouble taking your prenatal vitamins, talk with your health care provider about other options. Keep all follow-up visits. This is important. Follow-up visits include prenatal visits. Contact a health care provider if: You have pain in your abdomen. You have a severe headache. You have vision problems. You are losing weight. You feel weak or dizzy. You cannot eat or drink without vomiting, especially if this goes on for a full day. Get help right away if: You cannot drink fluids without vomiting. You vomit blood. You have constant nausea and vomiting. You are very weak. You faint. You have a fever and your symptoms suddenly get worse. Summary Hyperemesis gravidarum is a severe form of nausea and vomiting that happens during pregnancy. Making some changes to your eating habits may help relieve nausea and vomiting. This condition may be managed with lifestyle changes and medicines as prescribed by your health care provider. If medicines do not help relieve nausea and vomiting, you may need to receive fluids through an IV at the hospital. This information is not intended to replace advice given to you by your health care provider. Make sure you discuss any questions you have with your healthcare provider. Document Revised: 09/08/2019 Document Reviewed: 09/08/2019 Elsevier Patient Education  2022 [employer].

## 2020-08-27 NOTE — Progress Notes (Signed)
Patient ID: Michelle Monroe, female   DOB: 08-15-2000, 20 y.o.   MRN: [id number]  Reason for Consult: Gynecologic Exam   Referred by No ref. provider found  Subjective:     HPI:  Michelle Monroe is a 20 y.o. female she is following up today from the ER with a positive urine pregnancy test.  She reports that she found out that she was pregnant on June 8 with a home positive pregnancy test.  This was confirmed the same day in the ER.  She reports that she had a small amount of spotting on 12 June.  Since then she has not had any further vaginal bleeding.  She reports that she is having daily nausea.  She vomited 1 time.  She is uncertain of the first day of her last period but believes that it was around April 28.  She reports that she normally has a monthly menstrual cycle.  She was trying to conceive and was not using any contraception at the time that she became pregnant.  She is currently taking a prenatal vitamin.   No past medical history on file. No family history on file. No past surgical history on file.  Short Social History:  Social History   Tobacco Use   Smoking status: Never   Smokeless tobacco: Never  Substance Use Topics   Alcohol use: Not on file    No Known Allergies  Current Outpatient Medications  Medication Sig Dispense Refill   fexofenadine-pseudoephedrine (ALLEGRA-D) 60-120 MG 12 hr tablet Take 1 tablet by mouth 2 (two) times daily. (Patient not taking: Reported on 08/27/2020) 20 tablet 0   ibuprofen (ADVIL) 800 MG tablet Take 1 tablet (800 mg total) by mouth every 8 (eight) hours as needed for moderate pain. (Patient not taking: Reported on 08/27/2020) 15 tablet 0   lidocaine (XYLOCAINE) 2 % solution Use as directed 5 mLs in the mouth or throat every 6 (six) hours as needed for mouth pain. (Patient not taking: Reported on 08/27/2020) 100 mL 0   methylPREDNISolone (MEDROL DOSEPAK) 4 MG TBPK tablet Take Tapered dose as directed (Patient not taking: Reported on  08/27/2020) 21 tablet 0   ondansetron (ZOFRAN-ODT) 4 MG disintegrating tablet Take 1 tablet (4 mg total) by mouth every 8 (eight) hours as needed for nausea or vomiting. (Patient not taking: Reported on 08/27/2020) 20 tablet 0   No current facility-administered medications for this visit.    Review of Systems  Constitutional: Positive for fatigue. Negative for chills, fever and unexpected weight change.  HENT: Negative for trouble swallowing.  Eyes: Negative for loss of vision.  Respiratory: Negative for cough, shortness of breath and wheezing.  Cardiovascular: Negative for chest pain, leg swelling, palpitations and syncope.  GI: Positive for diarrhea and nausea. Negative for abdominal pain, blood in stool and vomiting.  GU: Positive for frequency. Negative for difficulty urinating, dysuria and hematuria.  Musculoskeletal: Negative for back pain, leg pain and joint pain.  Skin: Negative for rash.  Neurological: Negative for dizziness, headaches, light-headedness, numbness and seizures.  Psychiatric: Negative for behavioral problem, confusion, depressed mood and sleep disturbance.       Positive anxiety      Objective:  Objective   Vitals:   08/27/20 0926  BP: 108/70  Weight: 270 lb 9.6 oz (122.7 kg)  Height: 5\' 3"  (1.6 m)   Body mass index is 47.93 kg/m.  Physical Exam Vitals and nursing note reviewed. Exam conducted with a chaperone present.  Constitutional:  Appearance: Normal appearance. She is well-developed.  HENT:     Head: Normocephalic and atraumatic.  Eyes:     Extraocular Movements: Extraocular movements intact.     Pupils: Pupils are equal, round, and reactive to light.  Cardiovascular:     Rate and Rhythm: Normal rate and regular rhythm.  Pulmonary:     Effort: Pulmonary effort is normal. No respiratory distress.     Breath sounds: Normal breath sounds.  Abdominal:     General: Abdomen is flat.     Palpations: Abdomen is soft.  Genitourinary:     Comments: External: Normal appearing vulva. No lesions noted.  Speculum examination: Normal appearing cervix. No blood in the vaginal vault. No discharge.  No IUD strings seen Bimanual examination: Uterus midline, non-tender, normal in size, shape and contour.  No CMT. No adnexal masses. No adnexal tenderness. Pelvis not fixed.  Breast exam: breast equal without skin changes, nipple discharge, breast lump or enlarged lymph nodes Musculoskeletal:        General: No signs of injury.  Skin:    General: Skin is warm and dry.  Neurological:     Mental Status: She is alert and oriented to person, place, and time.  Psychiatric:        Behavior: Behavior normal.        Thought Content: Thought content normal.        Judgment: Judgment normal.    Assessment/Plan:     20 year old with intrauterine pregnancy.  Dating today suggestive of 7 weeks 5 days pregnancy. Will refer to MFM for official dating ultrasound new OB labs today.  We will schedule patient to return for a formal new OB visit.  Provide information regarding regarding early pregnancy.  More than 30 minutes were spent face to face with the patient in the room, reviewing the medical record, labs and images, and coordinating care for the patient. The plan of management was discussed in detail and counseling was provided.    Adelene Idler MD [facility], [facility] 08/27/2020 10:14 AM

## 2020-08-31 LAB — RPR+RH+ABO+RUB AB+AB SCR+CB...
Antibody Screen: NEGATIVE
HIV Screen 4th Generation wRfx: NONREACTIVE
Hematocrit: 35.6 % (ref 34.0–46.6)
Hemoglobin: 11.2 g/dL (ref 11.1–15.9)
Hepatitis B Surface Ag: NEGATIVE
MCH: 25.3 pg — ABNORMAL LOW (ref 26.6–33.0)
MCHC: 31.5 g/dL (ref 31.5–35.7)
MCV: 81 fL (ref 79–97)
Platelets: 291 10*3/uL (ref 150–450)
RBC: 4.42 x10E6/uL (ref 3.77–5.28)
RDW: 13.5 % (ref 11.7–15.4)
RPR Ser Ql: NONREACTIVE
Rh Factor: POSITIVE
Rubella Antibodies, IGG: 1.22 index (ref >0.99)
Varicella zoster IgG: <135 index — ABNORMAL LOW (ref >165)
WBC: 6.5 10*3/uL (ref 3.4–10.8)

## 2020-08-31 LAB — HGB FRACTIONATION CASCADE
Hgb A2: 2.7 % (ref 1.8–3.2)
Hgb A: 97.3 % (ref 96.4–98.8)
Hgb F: 0 % (ref 0.0–2.0)
Hgb S: 0 %

## 2020-08-31 LAB — HEPATITIS C ANTIBODY: Hep C Virus Ab: 0.2 s/co ratio (ref 0.0–0.9)

## 2020-09-01 ENCOUNTER — Other Ambulatory Visit: Payer: Self-pay | Admitting: Obstetrics and Gynecology

## 2020-09-01 DIAGNOSIS — A5909 Other urogenital trichomoniasis: Secondary | ICD-10-CM

## 2020-09-01 LAB — CYTOLOGY - PAP
Chlamydia: NEGATIVE
Comment: NEGATIVE
Comment: NEGATIVE
Comment: NORMAL
Diagnosis: NEGATIVE
Neisseria Gonorrhea: NEGATIVE
Trichomonas: POSITIVE — AB

## 2020-09-01 MED ORDER — METRONIDAZOLE 500 MG PO TABS: ORAL_TABLET | ORAL | 1 refills | Status: DC

## 2020-09-01 NOTE — Progress Notes (Signed)
Rx sent for Trichomonas infection.  Discussed with patient on the phone.

## 2020-09-16 ENCOUNTER — Ambulatory Visit (INDEPENDENT_AMBULATORY_CARE_PROVIDER_SITE_OTHER): Payer: Medicaid Other | Admitting: Obstetrics and Gynecology

## 2020-09-16 ENCOUNTER — Other Ambulatory Visit: Payer: Self-pay

## 2020-09-16 ENCOUNTER — Encounter: Payer: Self-pay | Admitting: Obstetrics and Gynecology

## 2020-09-16 VITALS — BP 128/70 | Ht 63.0 in | Wt 276.2 lb

## 2020-09-16 DIAGNOSIS — N926 Irregular menstruation, unspecified: Secondary | ICD-10-CM

## 2020-09-16 DIAGNOSIS — Z1379 Encounter for other screening for genetic and chromosomal anomalies: Secondary | ICD-10-CM

## 2020-09-16 DIAGNOSIS — Z9189 Other specified personal risk factors, not elsewhere classified: Secondary | ICD-10-CM

## 2020-09-16 DIAGNOSIS — O0990 Supervision of high risk pregnancy, unspecified, unspecified trimester: Secondary | ICD-10-CM | POA: Insufficient documentation

## 2020-09-16 DIAGNOSIS — Z3401 Encounter for supervision of normal first pregnancy, first trimester: Secondary | ICD-10-CM

## 2020-09-16 DIAGNOSIS — O99210 Obesity complicating pregnancy, unspecified trimester: Secondary | ICD-10-CM

## 2020-09-16 DIAGNOSIS — Z3490 Encounter for supervision of normal pregnancy, unspecified, unspecified trimester: Secondary | ICD-10-CM | POA: Insufficient documentation

## 2020-09-16 NOTE — Progress Notes (Signed)
09/16/2020   Chief Complaint: Missed period  Transfer of Care Patient: no  History of Present Illness: Michelle Monroe is a 20 y.o. G1P0 Unknown based on No LMP recorded (lmp unknown). Patient is pregnant. with an Estimated Date of Delivery: None noted., with the above CC.   Her periods were: monthly She was using no method when she conceived.  She has Positive signs or symptoms of nausea/vomiting of pregnancy. She has Negative signs or symptoms of miscarriage or preterm labor She was not taking different medications around the time she conceived/early pregnancy. Since her LMP, she has not used alcohol Since her LMP, she has not used tobacco products Since her LMP, she has not used illegal drugs.    Current or past history of domestic violence. no  Infection History:  1. Since her LMP, she has not had a viral illness.  2. She denies close contact with children on a regular basis     3. She denies a history of chicken pox. She denies vaccination for chicken pox in the past. 4. Patient or partner has history of genital herpes  no 5. History of STI (GC, CT, HPV, syphilis, HIV)  yes Recent trichomonas infections 6.  She does not live with someone with TB or TB exposed. 7. History of recent travel :  no 8. She identifies Negative Zika risk factors for her and her partner 61. There are not cats in the home in the home.  She understands that while pregnant she should not change cat litter.   Genetic Screening Questions: (Includes patient, baby's father, or anyone in either family)   1. Patient's age >/= 32 at [facility]  no 2. Thalassemia ([place], [place], Mediterranean, or Asian background): MCV<80  no 3. Neural tube defect (meningomyelocele, spina bifida, anencephaly)  no 4. Congenital heart defect  no  5. Down syndrome  no 6. Tay-Sachs (Jewish, [place]  no 7. Canavan's Disease  no 8. Sickle cell disease or trait (African)  no  9. Hemophilia or other blood disorders  no  10. Muscular  dystrophy  no  11. Cystic fibrosis  no  12. Huntington's Chorea  no  13. Mental retardation/autism  no 14. Other inherited genetic or chromosomal disorder  no 15. Maternal metabolic disorder (DM, PKU, etc)  no 16. Patient or FOB with a child with a birth defect not listed above no  16a. Patient or FOB with a birth defect themselves no 17. Recurrent pregnancy loss, or stillbirth  no  18. Any medications since LMP other than prenatal vitamins (include vitamins, supplements, OTC meds, drugs, alcohol)  not applicable 19. Any other genetic/environmental exposure to discuss  no  Do you snore loudly? ( louder than talking or loud enough to be heard through closed doors?) YES Do you often feel tired, fatigued, or sleepy during daytime? YES Has anyone observed you stop breathing during sleep? NO Do you have or are you being treated for high blood pressure? NO BMI> 35 YES Age> 50 NO Neck circumference > 40 cm NO- 38 cm Female gender? NO  ** if yes to > 3 questions high risk of obstructive sleep apnea ** if yes to <3 questions+ low risk for obstructive sleep apnea    ROS:  Review of Systems  Constitutional:  Negative for chills, fever, malaise/fatigue and weight loss.  HENT:  Negative for congestion, hearing loss and sinus pain.   Eyes:  Negative for blurred vision and double vision.  Respiratory:  Negative for cough, sputum production,  shortness of breath and wheezing.   Cardiovascular:  Negative for chest pain, palpitations, orthopnea and leg swelling.  Gastrointestinal:  Negative for abdominal pain, constipation, diarrhea, nausea and vomiting.  Genitourinary:  Negative for dysuria, flank pain, frequency, hematuria and urgency.  Musculoskeletal:  Negative for back pain, falls and joint pain.  Skin:  Negative for itching and rash.  Neurological:  Negative for dizziness and headaches.  Psychiatric/Behavioral:  Negative for depression, substance abuse and suicidal ideas. The patient  is not nervous/anxious.    OBGYN History: As per HPI. OB History  Gravida Para Term Preterm AB Living  1            SAB IAB Ectopic Multiple Live Births               # Outcome Date GA Lbr Len/2nd Weight Sex Delivery Anes PTL Lv  1 Current             Any issues with any prior pregnancies: not applicable Any prior children are healthy, doing well, without any problems or issues: not applicable Last pap smear 2022 NIL  History of STIs: Yes   Past Medical History: No past medical history on file.  Past Surgical History: No past surgical history on file.  Family History:  No family history on file. She denies any female cancers, bleeding or blood clotting disorders.   Social History:  Social History   Socioeconomic History   Marital status: Single    Spouse name: Not on file   Number of children: Not on file   Years of education: Not on file   Highest education level: Not on file  Occupational History   Not on file  Tobacco Use   Smoking status: Never   Smokeless tobacco: Never  Substance and Sexual Activity   Alcohol use: Not on file   Drug use: Yes    Types: Marijuana   Sexual activity: Not on file  Other Topics Concern   Not on file  Social History Narrative   Not on file   Social Determinants of Health   Financial Resource Strain: Not on file  Food Insecurity: Not on file  Transportation Needs: Not on file  Physical Activity: Not on file  Stress: Not on file  Social Connections: Not on file  Intimate Partner Violence: Not on file    Allergy: No Known Allergies  Current Outpatient Medications:  Current Outpatient Medications:    fexofenadine-pseudoephedrine (ALLEGRA-D) 60-120 MG 12 hr tablet, Take 1 tablet by mouth 2 (two) times daily. (Patient not taking: Reported on 08/27/2020), Disp: 20 tablet, Rfl: 0   ibuprofen (ADVIL) 800 MG tablet, Take 1 tablet (800 mg total) by mouth every 8 (eight) hours as needed for moderate pain. (Patient not taking:  Reported on 08/27/2020), Disp: 15 tablet, Rfl: 0   lidocaine (XYLOCAINE) 2 % solution, Use as directed 5 mLs in the mouth or throat every 6 (six) hours as needed for mouth pain. (Patient not taking: Reported on 08/27/2020), Disp: 100 mL, Rfl: 0   methylPREDNISolone (MEDROL DOSEPAK) 4 MG TBPK tablet, Take Tapered dose as directed (Patient not taking: Reported on 08/27/2020), Disp: 21 tablet, Rfl: 0   metroNIDAZOLE (FLAGYL) 500 MG tablet, Take two tablets by mouth twice a day, for one day.  Or you can take all four tablets at once if you can tolerate it. (Patient not taking: Reported on 09/16/2020), Disp: 4 tablet, Rfl: 1   ondansetron (ZOFRAN-ODT) 4 MG disintegrating tablet, Take 1 tablet (4  mg total) by mouth every 8 (eight) hours as needed for nausea or vomiting. (Patient not taking: Reported on 08/27/2020), Disp: 20 tablet, Rfl: 0   Physical Exam: Physical Exam Vitals and nursing note reviewed.  HENT:     Head: Normocephalic and atraumatic.  Eyes:     Pupils: Pupils are equal, round, and reactive to light.  Neck:     Thyroid: No thyromegaly.  Cardiovascular:     Rate and Rhythm: Normal rate and regular rhythm.  Pulmonary:     Effort: Pulmonary effort is normal.  Abdominal:     General: Bowel sounds are normal. There is no distension.     Palpations: Abdomen is soft.     Tenderness: There is no abdominal tenderness. There is no guarding or rebound.  Musculoskeletal:        General: Normal range of motion.     Cervical back: Normal range of motion and neck supple.  Skin:    General: Skin is warm and dry.  Neurological:     Mental Status: She is alert and oriented to person, place, and time.  Psychiatric:        Mood and Affect: Affect normal.        Judgment: Judgment normal.     Assessment: Michelle Monroe is a 20 y.o. G1P0 Unknown based on No LMP recorded (lmp unknown). Patient is pregnant. with an Estimated Date of Delivery: None noted.,  for prenatal care.  Plan:  1) Avoid alcoholic  beverages. 2) Patient encouraged not to smoke.  3) Discontinue the use of all non-medicinal drugs and chemicals.  4) Take prenatal vitamins daily.  5) Seatbelt use advised 6) Nutrition, food safety (fish, cheese advisories, and high nitrite foods) and exercise discussed. 7) Hospital and practice style delivering at [facility] discussed  8) Patient is asked about travel to areas at risk for the Zika virus, and counseled to avoid travel and exposure to mosquitoes or sexual partners who may have themselves been exposed to the virus. Testing is discussed, and will be ordered as appropriate.  9) Childbirth classes at [facility] advised 10) Genetic Screening, such as with 1st Trimester Screening, cell free fetal DNA, AFP testing, and Ultrasound, as well as with amniocentesis and CVS as appropriate, is discussed with patient. She plans to have genetic testing this pregnancy.  Labs today, some not collected last visit Maternit21 testing today Has MFM follow up planned Provided with information regarding healthy pregnancy Referral for sleep apnea screening Reports she has completed trichomonas treatment but her partner has not been treated.  Covid vaccination discussed Encouraged 81 mg ASA initiation at 12 weeks  Problem list reviewed and updated.  I discussed the assessment and treatment plan with the patient. The patient was provided an opportunity to ask questions and all were answered. The patient agreed with the plan and demonstrated an understanding of the instructions.  Adelene Idler MD [facility], [facility] 09/16/2020 9:56 AM

## 2020-09-16 NOTE — Patient Instructions (Addendum)
Start a daily 81 mg Aspirin after 12 weeks of pregnancy to lower your risk of preeclampsia.  Initial steps to help :   B6 (pyridoxine) 25 mg,  3-4 times a day- 200 mg a day total Unisom (doxylamine) 25 mg at bedtime **B6 and Unisom are available as a combination prescription medications called diclegis and bonjesta  B1 (thiamin)  50-100 mg 1-2 a day-  100 mg a day total  Continue prenatal vitamin with iron and thiamin. If it is not tolerated switch to 1 mg of folic acid.  Can add medication for gastric reflux if needed.  Subsequent steps to be added to B1, B6, and Unisom:  Antihistamine (one of the following medications) Dramamine      25-50 mg every 4-6 hours Benadryl      25-50 mg every 4-6 hours Meclizine      25 mg every 6 hours  2. Dopamine Antagonist (one of the following medications) Metoclopramide  (Reglan)  5-10 mg every 6-8 hours         PO Promethazine   (Phenergan)   12.5-25 mg every 4-6 hours      PO or rectal Prochlorperazine  (Compazine)  5-10 mg every 6-8 hours     37m BID rectally  Subsequent steps if there has still not been improvement in symptoms: 3. Daily stool softner:  Colace 100 mg twice a day 4. Ondansetron  (Zofran)   4-8 mg every 6-8 hours    Obstetrics: Normal and Problem Pregnancies (7th ed., pp. 102-121). [place]: Elsevier."> Textbook of Family Medicine (9th ed., pp. [phone]. [place] Elsevier [employer].">  First Trimester of Pregnancy  The first trimester of pregnancy starts on the first day of your last menstrual period until the end of week 12. This is months 1 through 3 of pregnancy. A week after a sperm fertilizes an egg, the egg will implant into the wall of the uterus and begin to develop into a baby. By the end of 12 weeks, all the baby'sorgans will be formed and the baby will be 2-3 inches in size. Body changes during your first trimester Your body goes through many changes during pregnancy. The changes vary andgenerally return  to normal after your baby is born. Physical changes You may gain or lose weight. Your breasts may begin to grow larger and become tender. The tissue that surrounds your nipples (areola) may become darker. Dark spots or blotches (chloasma or mask of pregnancy) may develop on your face. You may have changes in your hair. These can include thickening or thinning of your hair or changes in texture. Health changes You may feel nauseous, and you may vomit. You may have heartburn. You may develop headaches. You may develop constipation. Your gums may bleed and may be sensitive to brushing and flossing. Other changes You may tire easily. You may urinate more often. Your menstrual periods will stop. You may have a loss of appetite. You may develop cravings for certain kinds of food. You may have changes in your emotions from day to day. You may have more vivid and strange dreams. Follow these instructions at home: Medicines Follow your health care provider's instructions regarding medicine use. Specific medicines may be either safe or unsafe to take during pregnancy. Do not take any medicines unless told to by your health care provider. Take a prenatal vitamin that contains at least 600 micrograms (mcg) of folic acid. Eating and drinking Eat a healthy diet that includes fresh fruits and vegetables, whole  grains, good sources of protein such as meat, eggs, or tofu, and low-fat dairy products. Avoid raw meat and unpasteurized juice, milk, and cheese. These carry germs that can harm you and your baby. If you feel nauseous or you vomit: Eat 4 or 5 small meals a day instead of 3 large meals. Try eating a few soda crackers. Drink liquids between meals instead of during meals. You may need to take these actions to prevent or treat constipation: Drink enough fluid to keep your urine pale yellow. Eat foods that are high in fiber, such as beans, whole grains, and fresh fruits and vegetables. Limit  foods that are high in fat and processed sugars, such as fried or sweet foods. Activity Exercise only as directed by your health care provider. Most people can continue their usual exercise routine during pregnancy. Try to exercise for 30 minutes at least 5 days a week. Stop exercising if you develop pain or cramping in the lower abdomen or lower back. Avoid exercising if it is very hot or humid or if you are at high altitude. Avoid heavy lifting. If you choose to, you may have sex unless your health care provider tells you not to. Relieving pain and discomfort Wear a good support bra to relieve breast tenderness. Rest with your legs elevated if you have leg cramps or low back pain. If you develop bulging veins (varicose veins) in your legs: Wear support hose as told by your health care provider. Elevate your feet for 15 minutes, 3-4 times a day. Limit salt in your diet. Safety Wear your seat belt at all times when driving or riding in a car. Talk with your health care provider if someone is verbally or physically abusive to you. Talk with your health care provider if you are feeling sad or have thoughts of hurting yourself. Lifestyle Do not use hot tubs, steam rooms, or saunas. Do not douche. Do not use tampons or scented sanitary pads. Do not use herbal remedies, alcohol, illegal drugs, or medicines that are not approved by your health care provider. Chemicals in these products can harm your baby. Do not use any products that contain nicotine or tobacco, such as cigarettes, e-cigarettes, and chewing tobacco. If you need help quitting, ask your health care provider. Avoid cat litter boxes and soil used by cats. These carry germs that can cause birth defects in the baby and possibly loss of the unborn baby (fetus) by miscarriage or stillbirth. General instructions During routine prenatal visits in the first trimester, your health care provider will do a physical exam, perform necessary  tests, and ask you how things are going. Keep all follow-up visits. This is important. Ask for help if you have counseling or nutritional needs during pregnancy. Your health care provider can offer advice or refer you to specialists for help with various needs. Schedule a dentist appointment. At home, brush your teeth with a soft toothbrush. Floss gently. Write down your questions. Take them to your prenatal visits. Where to find more information American Pregnancy Association: americanpregnancy.[place] and Gynecologists: PoolDevices.com.pt Office on [employer]: KeywordPortfolios.com.br Contact a health care provider if you have: Dizziness. A fever. Mild pelvic cramps, pelvic pressure, or nagging pain in the abdominal area. Nausea, vomiting, or diarrhea that lasts for 24 hours or longer. A bad-smelling vaginal discharge. Pain when you urinate. Known exposure to a contagious illness, such as chickenpox, measles, Zika virus, HIV, or hepatitis. Get help right away if you have: Spotting or  bleeding from your vagina. Severe abdominal cramping or pain. Shortness of breath or chest pain. Any kind of trauma, such as from a fall or a car crash. New or increased pain, swelling, or redness in an arm or leg. Summary The first trimester of pregnancy starts on the first day of your last menstrual period until the end of week 12 (months 1 through 3). Eating 4 or 5 small meals a day rather than 3 large meals may help to relieve nausea and vomiting. Do not use any products that contain nicotine or tobacco, such as cigarettes, e-cigarettes, and chewing tobacco. If you need help quitting, ask your health care provider. Keep all follow-up visits. This is important. This information is not intended to replace advice given to you by your health care provider. Make sure you discuss any questions you have with your healthcare provider. Document Revised:  07/23/2019 Document Reviewed: 05/29/2019 Elsevier Patient Education  2022 [place]. Pregnancy and Vaccinations Vaccines can help to keep you healthy. There are some vaccines that should begiven before pregnancy and some that should be given during pregnancy. How does this affect me? If you are pregnant or thinking about getting pregnant, talk with your healthcare provider about what vaccines are right for you. How does this affect my baby? Usually, the benefits of receiving vaccines during pregnancy outweigh the risks of harm to you or your baby if: The risk of being exposed to a disease is high. Infection would pose a risk to you or your unborn baby. The vaccine is not likely to cause harm. Vaccines can help protect your baby from some diseases until he or she is oldenough to get the vaccine. What can I do to lower my risk? When you receive the recommended vaccines, it helps to protect you from gettingcertain diseases and passing them on to your baby. Should I receive vaccines before pregnancy? If possible, make sure that your vaccines are up to date before you become pregnant. It is safe and important for you to receive weakened viral and weakened bacterial vaccines (inactivated vaccines) as needed before you are pregnant. Live viral and live bacterial vaccines, such as the measles, mumps, and rubella (MMR) vaccine, should be given 1 month or more before pregnancy. Sometimes, women become pregnant within 1 month of receiving a live vaccine that is not usually recommended during pregnancy. The U.S. Centers for Disease Control and Prevention (CDC) has reported that when this has happened, vaccineshave not harmed pregnant women or their unborn babies. Should I receive vaccines during pregnancy?  It is safe and important for you to receive some inactivated vaccines as needed during pregnancy. Until your baby can receive vaccines, your baby will get some protection from diseases through the  vaccines that you receive while you arepregnant. During your pregnancy, you should receive the following: Influenza vaccine (the flu shot). The flu shot may protect you and your baby (up to 35 months of age) from some complications associated with strains of influenza that are covered by the vaccine. Pregnant women can receive the flu shot at any time during pregnancy. Tetanus, diphtheria, and pertussis (Tdap) vaccine. The Tdap vaccine will help to prevent whooping cough (pertussis) in you and your baby. You should receive 1 dose of this vaccine during each pregnancy. It is recommended that pregnant women receive this vaccine between 27 and 36 weeks of pregnancy. Should I receive vaccines after pregnancy? It is safe and important for you to receive vaccines as needed after pregnancy. Some are  safe to have if you are breastfeeding. Other vaccines may not be safe to have until after you have stopped breastfeeding. If you did not receive the Tdap vaccine during your pregnancy and have never received a Tdap vaccine, you should receive that vaccine right after you give birth to your baby (delivery). If you are not immune to measles, mumps, rubella, or chickenpox (varicella), you should receive those vaccines within days after delivery. It is important to talk with your health care provider about what vaccines you may need after delivery. What if I am pregnant and I plan to travel internationally? If you are pregnant and you are planning to travel internationally, talk with your health care provider at least 4-6 weeks before your trip. Depending on the country you are planning to visit, you may need to take special precautions orget certain vaccines to prevent disease. Vaccines that may be recommended for pregnant international travelers include: Influenza (the flu shot). Tetanus and diphtheria (Td) or Tdap. Hepatitis B (HepB). Hepatitis A (HepA). Your health care provider can help you decide if you  need vaccines and if thebenefits outweigh the risk of disease exposure. Follow these instructions at home: Take over-the-counter and prescription medicines as told by your health care provider. Keep all follow-up visits as told by your health care provider. This is important. Questions to ask your health care provider: What vaccines are safe during pregnancy? What are the risks of vaccines during pregnancy? What are the potential side effects of vaccines during or after pregnancy? When should I get vaccines during pregnancy? Contact a health care provider if you: Believe you have had a reaction to a vaccine. Have concerns or questions about a vaccine. Become pregnant within 1 month after you have received a live vaccine. Summary Vaccines are the most effective way to prevent certain diseases. Many vaccines are safe to receive during pregnancy. Some vaccines are recommended during pregnancy to protect you and your baby from getting sick. If you are pregnant or planning to become pregnant, talk with your health care provider about what vaccines are right for you. This information is not intended to replace advice given to you by your health care provider. Make sure you discuss any questions you have with your healthcare provider. Document Revised: 07/23/2019 Document Reviewed: 03/20/2018 Elsevier Patient Education  2022 [employer].

## 2020-09-17 ENCOUNTER — Other Ambulatory Visit: Payer: Self-pay | Admitting: Obstetrics and Gynecology

## 2020-09-17 DIAGNOSIS — R7303 Prediabetes: Secondary | ICD-10-CM

## 2020-09-17 LAB — COMPREHENSIVE METABOLIC PANEL
ALT: 12 IU/L (ref 0–32)
AST: 17 IU/L (ref 0–40)
Albumin/Globulin Ratio: 1.4 (ref 1.2–2.2)
Albumin: 4 g/dL (ref 3.9–5.0)
Alkaline Phosphatase: 70 IU/L (ref 44–121)
BUN/Creatinine Ratio: 10 (ref 9–23)
BUN: 6 mg/dL (ref 6–20)
Bilirubin Total: 0.2 mg/dL (ref 0.0–1.2)
CO2: 19 mmol/L — ABNORMAL LOW (ref 20–29)
Calcium: 8.8 mg/dL (ref 8.7–10.2)
Chloride: 99 mmol/L (ref 96–106)
Creatinine, Ser: 0.62 mg/dL (ref 0.57–1.00)
Globulin, Total: 2.8 g/dL (ref 1.5–4.5)
Glucose: 80 mg/dL (ref 65–99)
Potassium: 4 mmol/L (ref 3.5–5.2)
Sodium: 136 mmol/L (ref 134–144)
Total Protein: 6.8 g/dL (ref 6.0–8.5)
eGFR: 130 mL/min/{1.73_m2} (ref >59)

## 2020-09-17 LAB — HEMOGLOBIN A1C
Est. average glucose Bld gHb Est-mCnc: 120 mg/dL
Hgb A1c MFr Bld: 5.8 % — ABNORMAL HIGH (ref 4.8–5.6)

## 2020-09-17 LAB — PROTEIN / CREATININE RATIO, URINE
Creatinine, Urine: 167.8 mg/dL
Protein, Ur: 11.7 mg/dL
Protein/Creat Ratio: 70 mg/g creat (ref 0–200)

## 2020-09-17 NOTE — Progress Notes (Signed)
Please call patient and schedule 1 GTT with her next prenatal visit.

## 2020-09-20 LAB — MATERNIT21 PLUS CORE+SCA
Fetal Fraction: 4
Monosomy X (Turner Syndrome): NOT DETECTED
Result (T21): NEGATIVE
Trisomy 13 (Patau syndrome): NEGATIVE
Trisomy 18 (Edwards syndrome): NEGATIVE
Trisomy 21 (Down syndrome): NEGATIVE
XXX (Triple X Syndrome): NOT DETECTED
XXY (Klinefelter Syndrome): NOT DETECTED
XYY (Jacobs Syndrome): NOT DETECTED

## 2020-09-21 ENCOUNTER — Telehealth: Payer: Self-pay

## 2020-09-21 NOTE — Telephone Encounter (Signed)
Pt calling for gender results.  [phone]  Pt aware of normal MaterniT 19 and female.

## 2020-10-01 ENCOUNTER — Other Ambulatory Visit (HOSPITAL_COMMUNITY)
Admission: RE | Admit: 2020-10-01 | Discharge: 2020-10-01 | Disposition: A | Discharge status: Home / Self Care | Payer: Medicaid Other | Source: Ambulatory Visit | Attending: Obstetrics and Gynecology | Admitting: Obstetrics and Gynecology

## 2020-10-01 ENCOUNTER — Encounter: Payer: Self-pay | Admitting: Obstetrics and Gynecology

## 2020-10-01 ENCOUNTER — Ambulatory Visit (INDEPENDENT_AMBULATORY_CARE_PROVIDER_SITE_OTHER): Payer: Medicaid Other | Admitting: Obstetrics and Gynecology

## 2020-10-01 ENCOUNTER — Other Ambulatory Visit: Payer: Medicaid Other

## 2020-10-01 ENCOUNTER — Other Ambulatory Visit: Payer: Self-pay

## 2020-10-01 VITALS — BP 130/70 | Ht 63.0 in | Wt 277.0 lb

## 2020-10-01 DIAGNOSIS — A5901 Trichomonal vulvovaginitis: Secondary | ICD-10-CM | POA: Insufficient documentation

## 2020-10-01 DIAGNOSIS — O99891 Other specified diseases and conditions complicating pregnancy: Secondary | ICD-10-CM

## 2020-10-01 DIAGNOSIS — O99210 Obesity complicating pregnancy, unspecified trimester: Secondary | ICD-10-CM

## 2020-10-01 DIAGNOSIS — M549 Dorsalgia, unspecified: Secondary | ICD-10-CM

## 2020-10-01 DIAGNOSIS — Z3401 Encounter for supervision of normal first pregnancy, first trimester: Secondary | ICD-10-CM

## 2020-10-01 DIAGNOSIS — R7303 Prediabetes: Secondary | ICD-10-CM

## 2020-10-01 DIAGNOSIS — Z3A12 12 weeks gestation of pregnancy: Secondary | ICD-10-CM

## 2020-10-01 MED ORDER — ASPIRIN EC 81 MG PO TBEC: 81.0000 mg | DELAYED_RELEASE_TABLET | Freq: Every day | ORAL | 0 refills | Status: AC

## 2020-10-01 NOTE — Progress Notes (Signed)
Routine Prenatal Care Visit  Subjective  Michelle Monroe is a 20 y.o. G1P0 at [redacted]w[redacted]d being seen today for ongoing prenatal care.  She is currently monitored for the following issues for this low-risk pregnancy and has Encounter for supervision of normal first pregnancy in first trimester on their problem list.  ----------------------------------------------------------------------------------- Patient reports no complaints.   Contractions: Not present. Vag. Bleeding: None.  Movement: Absent. Denies leaking of fluid.  ----------------------------------------------------------------------------------- The following portions of the patient's history were reviewed and updated as appropriate: allergies, current medications, past family history, past medical history, past social history, past surgical history and problem list. Problem list updated.   Objective  Blood pressure 130/70, height 5\' 3"  (1.6 m), weight 277 lb (125.6 kg). Pregravid weight Pregravid weight not on file Total Weight Gain Not found. Urinalysis:      Fetal Status: Fetal Heart Rate (bpm): 145   Movement: Absent     General:  Alert, oriented and cooperative. Patient is in no acute distress.  Skin: Skin is warm and dry. No rash noted.   Cardiovascular: Normal heart rate noted  Respiratory: Normal respiratory effort, no problems with respiration noted  Abdomen: Soft, gravid, appropriate for gestational age. Pain/Pressure: Present     Pelvic:  Cervical exam deferred        Extremities: Normal range of motion.  Edema: None  Mental Status: Normal mood and affect. Normal behavior. Normal judgment and thought content.     Assessment   20 y.o. G1P0 at [redacted]w[redacted]d by  04/10/2021, by Ultrasound presenting for routine prenatal visit  Plan   pregnancy 1 Problems (from 09/16/20 to present)     Problem Noted Resolved   Encounter for supervision of normal first pregnancy in first trimester 09/16/2020 by 09/18/2020, MD No    Overview Addendum 09/16/2020 12:48 PM by 09/18/2020, MD     Nursing Staff Provider  Office Location  [facility] Dating   7 wk Natale Milch  Language  English Anatomy US    Flu Vaccine   Genetic Screen  NIPS:   TDaP vaccine    Hgb A1C or  GTT Early : Third trimester :   Covid  unvaccinated   LAB RESULTS   Rhogam  Not needed Blood Type A/Positive/-- (07/01 1033)   Feeding Plan  Antibody Negative (07/01 1033)  Contraception  Rubella 1.22 (07/01 1033)  Circumcision  RPR Non Reactive (07/01 1033)   Pediatrician   HBsAg Negative (07/01 1033)   Support Person  HIV Non Reactive (07/01 1033)  Prenatal Classes  Varicella Non immune    GBS  (For PCN allergy, check sensitivities)   BTL Consent     VBAC Consent  Pap  2022 NIL    Hgb Electro   Normal    CF      SMA        Obesity in pregnancy pregravid BMI: 48  Trichomonas in pregnancy- Treated ** Test of cure needed in August 2022           Rx for aspirin sent.  Discussed starting now and continuing through the end of pregnancy. Referral for physical therapy given patient's complaints of back pain. Referral to anesthesiology for obesity in pregnancy. Test of cure testing today for trichomonas.  Patient reports that her partner is seeking treatment soon. 1 hour glucose testing today.  Gestational age appropriate obstetric precautions including but not limited to vaginal bleeding, contractions, leaking of fluid and fetal movement were reviewed in detail  with the patient.    Return in about 2 weeks (around 10/15/2020) for ROB in person.  Natale Milch MD [facility], [facility] 10/01/2020, 10:42 AM

## 2020-10-01 NOTE — Patient Instructions (Addendum)
Second Trimester of Pregnancy  The second trimester of pregnancy is from week 13 through week 27. This is months 4 through 6 of pregnancy. The second trimester is often a time when you feel your best. Your body has adjusted to being pregnant, and you begin to feelbetter physically. During the second trimester: Morning sickness has lessened or stopped completely. You may have more energy. You may have an increase in appetite. The second trimester is also a time when the unborn baby (fetus) is growing rapidly. At the end of the sixth month, the fetus may be up to 12 inches long and weigh about 1 pounds. You will likely begin to feel the baby move (quickening) between 16 and 20 weeks of pregnancy. Body changes during your second trimester Your body continues to go through many changes during your second trimester.The changes vary and generally return to normal after the baby is born. Physical changes Your weight will continue to increase. You will notice your lower abdomen bulging out. You may begin to get stretch marks on your hips, abdomen, and breasts. Your breasts will continue to grow and to become tender. Dark spots or blotches (chloasma or mask of pregnancy) may develop on your face. A dark line from your belly button to the pubic area (linea nigra) may appear. You may have changes in your hair. These can include thickening of your hair, rapid growth, and changes in texture. Some people also have hair loss during or after pregnancy, or hair that feels dry or thin. Health changes You may develop headaches. You may have heartburn. You may develop constipation. You may develop hemorrhoids or swollen, bulging veins (varicose veins). Your gums may bleed and may be sensitive to brushing and flossing. You may urinate more often because the fetus is pressing on your bladder. You may have back pain. This is caused by: Weight gain. Pregnancy hormones that are relaxing the joints in your  pelvis. A shift in weight and the muscles that support your balance. Follow these instructions at home: Medicines Follow your health care provider's instructions regarding medicine use. Specific medicines may be either safe or unsafe to take during pregnancy. Do not take any medicines unless approved by your health care provider. Take a prenatal vitamin that contains at least 600 micrograms (mcg) of folic acid. Eating and drinking Eat a healthy diet that includes fresh fruits and vegetables, whole grains, good sources of protein such as meat, eggs, or tofu, and low-fat dairy products. Avoid raw meat and unpasteurized juice, milk, and cheese. These carry germs that can harm you and your baby. You may need to take these actions to prevent or treat constipation: Drink enough fluid to keep your urine pale yellow. Eat foods that are high in fiber, such as beans, whole grains, and fresh fruits and vegetables. Limit foods that are high in fat and processed sugars, such as fried or sweet foods. Activity Exercise only as directed by your health care provider. Most people can continue their usual exercise routine during pregnancy. Try to exercise for 30 minutes at least 5 days a week. Stop exercising if you develop contractions in your uterus. Stop exercising if you develop pain or cramping in the lower abdomen or lower back. Avoid exercising if it is very hot or humid or if you are at a high altitude. Avoid heavy lifting. If you choose to, you may have sex unless your health care provider tells you not to. Relieving pain and discomfort Wear a supportive bra to   prevent discomfort from breast tenderness. Take warm sitz baths to soothe any pain or discomfort caused by hemorrhoids. Use hemorrhoid cream if your health care provider approves. Rest with your legs raised (elevated) if you have leg cramps or low back pain. If you develop varicose veins: Wear support hose as told by your health care  provider. Elevate your feet for 15 minutes, 3-4 times a day. Limit salt in your diet. Safety Wear your seat belt at all times when driving or riding in a car. Talk with your health care provider if someone is verbally or physically abusive to you. Lifestyle Do not use hot tubs, steam rooms, or saunas. Do not douche. Do not use tampons or scented sanitary pads. Avoid cat litter boxes and soil used by cats. These carry germs that can cause birth defects in the baby and possibly loss of the fetus by miscarriage or stillbirth. Do not use herbal remedies, alcohol, illegal drugs, or medicines that are not approved by your health care provider. Chemicals in these products can harm your baby. Do not use any products that contain nicotine or tobacco, such as cigarettes, e-cigarettes, and chewing tobacco. If you need help quitting, ask your health care provider. General instructions During a routine prenatal visit, your health care provider will do a physical exam and other tests. He or she will also discuss your overall health. Keep all follow-up visits. This is important. Ask your health care provider for a referral to a local prenatal education class. Ask for help if you have counseling or nutritional needs during pregnancy. Your health care provider can offer advice or refer you to specialists for help with various needs. Where to find more information American Pregnancy Association: americanpregnancy.org [employer]: https://www.todd-brady.net/ Office on [employer] Health: Mighty[employer].nz Contact a health care provider if you have: A headache that does not go away when you take medicine. Vision changes or you see spots in front of your eyes. Mild pelvic cramps, pelvic pressure, or nagging pain in the abdominal area. Persistent nausea, vomiting, or diarrhea. A bad-smelling vaginal discharge or foul-smelling urine. Pain when you  urinate. Sudden or extreme swelling of your face, hands, ankles, feet, or legs. A fever. Get help right away if you: Have fluid leaking from your vagina. Have spotting or bleeding from your vagina. Have severe abdominal cramping or pain. Have difficulty breathing. Have chest pain. Have fainting spells. Have not felt your baby move for the time period told by your health care provider. Have new or increased pain, swelling, or redness in an arm or leg. Summary The second trimester of pregnancy is from week 13 through week 27 (months 4 through 6). Do not use herbal remedies, alcohol, illegal drugs, or medicines that are not approved by your health care provider. Chemicals in these products can harm your baby. Exercise only as directed by your health care provider. Most people can continue their usual exercise routine during pregnancy. Keep all follow-up visits. This is important. This information is not intended to replace advice given to you by your health care provider. Make sure you discuss any questions you have with your healthcare provider. Document Revised: 07/23/2019 Document Reviewed: 05/29/2019 Elsevier Patient Education  2022 Elsevier Inc. Back Pain in Pregnancy Back pain during pregnancy is common. Back pain may be caused by severalfactors that are related to changes during your pregnancy. Follow these instructions at home: Managing pain, stiffness, and swelling     If directed, for sudden (acute) back pain,  put ice on the painful area. Put ice in a plastic bag. Place a towel between your skin and the bag. Leave the ice on for 20 minutes, 2-3 times per day. If directed, apply heat to the affected area before you exercise. Use the heat source that your health care provider recommends, such as a moist heat pack or a heating pad. Place a towel between your skin and the heat source. Leave the heat on for 20-30 minutes. Remove the heat if your skin turns bright red. This is  especially important if you are unable to feel pain, heat, or cold. You may have a greater risk of getting burned. If directed, massage the affected area. Activity Exercise as told by your health care provider. Gentle exercise is the best way to prevent or manage back pain. Listen to your body when lifting. If lifting hurts, ask for help or bend your knees. This uses your leg muscles instead of your back muscles. Squat down when picking up something from the floor. Do not bend over. Only use bed rest for short periods as told by your health care provider. Bed rest should only be used for the most severe episodes of back pain. Standing, sitting, and lying down Do not stand in one place for long periods of time. Use good posture when sitting. Make sure your head rests over your shoulders and is not hanging forward. Use a pillow on your lower back if necessary. Try sleeping on your side, preferably the left side, with a pregnancy support pillow or 1-2 regular pillows between your legs. If you have back pain after a night's rest, your bed may be too soft. A firm mattress may provide more support for your back during pregnancy. General instructions Do not wear high heels. Eat a healthy diet. Try to gain weight within your health care provider's recommendations. Use a maternity girdle, elastic sling, or back brace as told by your health care provider. Take over-the-counter and prescription medicines only as told by your health care provider. Work with a physical therapist or massage therapist to find ways to manage back pain. Acupuncture or massage therapy may be helpful. Keep all follow-up visits as told by your health care provider. This is important. Contact a health care provider if: Your back pain interferes with your daily activities. You have increasing pain in other parts of your body. Get help right away if: You develop numbness, tingling, weakness, or problems with the use of your arms or  legs. You develop severe back pain that is not controlled with medicine. You have a change in bowel or bladder control. You develop shortness of breath, dizziness, or you faint. You develop nausea, vomiting, or sweating. You have back pain that is a rhythmic, cramping pain similar to labor pains. Labor pain is usually 1-2 minutes apart, lasts for about 1 minute, and involves a bearing down feeling or pressure in your pelvis. You have back pain and your water breaks or you have vaginal bleeding. You have back pain or numbness that travels down your leg. Your back pain developed after you fell. You develop pain on one side of your back. You see blood in your urine. You develop skin blisters in the area of your back pain. Summary Back pain may be caused by several factors that are related to changes during your pregnancy. Follow instructions as told by your health care provider for managing pain, stiffness, and swelling. Exercise as told by your health care provider. Gentle exercise  is the best way to prevent or manage back pain. Take over-the-counter and prescription medicines only as told by your health care provider. Keep all follow-up visits as told by your health care provider. This is important. This information is not intended to replace advice given to you by your health care provider. Make sure you discuss any questions you have with your healthcare provider. Document Revised: 01/30/2020 Document Reviewed: 08/01/2017 Elsevier Patient Education  2022 [employer].

## 2020-10-05 ENCOUNTER — Other Ambulatory Visit: Payer: Self-pay | Admitting: Obstetrics and Gynecology

## 2020-10-05 DIAGNOSIS — Z3401 Encounter for supervision of normal first pregnancy, first trimester: Secondary | ICD-10-CM

## 2020-10-05 DIAGNOSIS — O99211 Obesity complicating pregnancy, first trimester: Secondary | ICD-10-CM

## 2020-10-05 LAB — CERVICOVAGINAL ANCILLARY ONLY
Chlamydia: NEGATIVE
Comment: NEGATIVE
Comment: NEGATIVE
Comment: NORMAL
Neisseria Gonorrhea: NEGATIVE
Trichomonas: NEGATIVE

## 2020-10-07 ENCOUNTER — Ambulatory Visit: Payer: Medicaid Other | Attending: Maternal & Fetal Medicine

## 2020-10-07 ENCOUNTER — Other Ambulatory Visit: Payer: Self-pay

## 2020-10-07 VITALS — BP 95/61 | HR 93 | Temp 98.7°F | Ht 63.0 in | Wt 276.0 lb

## 2020-10-07 DIAGNOSIS — O99211 Obesity complicating pregnancy, first trimester: Secondary | ICD-10-CM | POA: Insufficient documentation

## 2020-10-07 DIAGNOSIS — Z3401 Encounter for supervision of normal first pregnancy, first trimester: Secondary | ICD-10-CM

## 2020-10-07 DIAGNOSIS — O283 Abnormal ultrasonic finding on antenatal screening of mother: Secondary | ICD-10-CM | POA: Diagnosis not present

## 2020-10-07 DIAGNOSIS — Z3A13 13 weeks gestation of pregnancy: Secondary | ICD-10-CM | POA: Diagnosis not present

## 2020-10-12 ENCOUNTER — Other Ambulatory Visit: Payer: Self-pay

## 2020-10-12 ENCOUNTER — Encounter: Payer: Self-pay | Admitting: Obstetrics and Gynecology

## 2020-10-12 ENCOUNTER — Ambulatory Visit (INDEPENDENT_AMBULATORY_CARE_PROVIDER_SITE_OTHER): Payer: Medicaid Other | Admitting: Obstetrics and Gynecology

## 2020-10-12 VITALS — BP 126/72 | Ht 63.0 in | Wt 277.4 lb

## 2020-10-12 DIAGNOSIS — Z3401 Encounter for supervision of normal first pregnancy, first trimester: Secondary | ICD-10-CM

## 2020-10-12 DIAGNOSIS — R7303 Prediabetes: Secondary | ICD-10-CM

## 2020-10-12 DIAGNOSIS — Z3A14 14 weeks gestation of pregnancy: Secondary | ICD-10-CM

## 2020-10-12 NOTE — Patient Instructions (Signed)

## 2020-10-12 NOTE — Progress Notes (Signed)
Routine Prenatal Care Visit  Subjective  Michelle [facility] is a 20 y.o. G1P0 at [redacted]w[redacted]d being seen today for ongoing prenatal care.  She is currently monitored for the following issues for this high-risk pregnancy and has Encounter for supervision of normal first pregnancy in first trimester on their problem list.  ----------------------------------------------------------------------------------- Patient reports no complaints.   Contractions: Not present. Vag. Bleeding: None.  Movement: Present. Denies leaking of fluid.  ----------------------------------------------------------------------------------- The following portions of the patient's history were reviewed and updated as appropriate: allergies, current medications, past family history, past medical history, past social history, past surgical history and problem list. Problem list updated.   Objective  Blood pressure 126/72, height 5\' 3"  (1.6 m), weight 277 lb 6.4 oz (125.8 kg). Pregravid weight Pregravid weight not on file Total Weight Gain Not found. Urinalysis:      Fetal Status: Fetal Heart Rate (bpm): 140   Movement: Present     General:  Alert, oriented and cooperative. Patient is in no acute distress.  Skin: Skin is warm and dry. No rash noted.   Cardiovascular: Normal heart rate noted  Respiratory: Normal respiratory effort, no problems with respiration noted  Abdomen: Soft, gravid, appropriate for gestational age. Pain/Pressure: Absent     Pelvic:  Cervical exam deferred        Extremities: Normal range of motion.  Edema: None  Mental Status: Normal mood and affect. Normal behavior. Normal judgment and thought content.     Assessment   20 y.o. G1P0 at [redacted]w[redacted]d by  04/10/2021, by Ultrasound presenting for routine prenatal visit  Plan   pregnancy 1 Problems (from 09/16/20 to present)     Problem Noted Resolved   Encounter for supervision of normal first pregnancy in first trimester 09/16/2020 by 09/18/2020,  MD No   Overview Addendum 10/01/2020 10:50 AM by 12/01/2020, MD     Nursing Staff Provider  Office Location  [facility] Dating   7 wk Natale Milch  Language  English Anatomy US    Flu Vaccine   Genetic Screen  NIPS: normal xx  TDaP vaccine    Hgb A1C or  GTT Early : prediabetic 1hr:  Third trimester :   Covid  unvaccinated   LAB RESULTS   Rhogam  Not needed Blood Type A/Positive/-- (07/01 1033)   Feeding Plan  Antibody Negative (07/01 1033)  Contraception  Rubella 1.22 (07/01 1033)  Circumcision  RPR Non Reactive (07/01 1033)   Pediatrician   HBsAg Negative (07/01 1033)   Support Person  HIV Non Reactive (07/01 1033)  Prenatal Classes  Varicella Non immune    GBS  (For PCN allergy, check sensitivities)   BTL Consent     VBAC Consent  Pap  2022 NIL    Hgb Electro   Normal    CF      SMA        Obesity in pregnancy pregravid BMI: 48  Trichomonas in pregnancy- Treated ** Test of cure needed in August 2022           1 GTT was part of [facility] collection problem and will need to be repeated.  She was able to start ASA.  [facility] repeat testing negative.   Gestational age appropriate obstetric precautions including but not limited to vaginal bleeding, contractions, leaking of fluid and fetal movement were reviewed in detail with the patient.    Return in about 2 weeks (around 10/26/2020) for 1 GTT ASAP- ROB in 2 weeks.  Natale Milch MD [facility], [facility] 10/12/2020, 3:52 PM

## 2020-10-14 ENCOUNTER — Other Ambulatory Visit: Payer: Medicaid Other

## 2020-10-19 ENCOUNTER — Emergency Department: Payer: Medicaid Other

## 2020-10-19 ENCOUNTER — Emergency Department
Admission: EM | Admit: 2020-10-19 | Discharge: 2020-10-19 | Disposition: A | Discharge status: Home / Self Care | Payer: Medicaid Other | Attending: Student in an Organized Health Care Education/Training Program | Admitting: Student in an Organized Health Care Education/Training Program

## 2020-10-19 ENCOUNTER — Other Ambulatory Visit: Payer: Self-pay

## 2020-10-19 ENCOUNTER — Encounter: Payer: Self-pay | Admitting: Emergency Medicine

## 2020-10-19 DIAGNOSIS — O209 Hemorrhage in early pregnancy, unspecified: Secondary | ICD-10-CM | POA: Insufficient documentation

## 2020-10-19 DIAGNOSIS — Z3A15 15 weeks gestation of pregnancy: Secondary | ICD-10-CM | POA: Diagnosis not present

## 2020-10-19 DIAGNOSIS — N898 Other specified noninflammatory disorders of vagina: Secondary | ICD-10-CM | POA: Insufficient documentation

## 2020-10-19 DIAGNOSIS — O4690 Antepartum hemorrhage, unspecified, unspecified trimester: Secondary | ICD-10-CM

## 2020-10-19 LAB — HCG, QUANTITATIVE, PREGNANCY: hCG, Beta Chain, Quant, S: 35731 m[IU]/mL — ABNORMAL HIGH (ref <5)

## 2020-10-19 LAB — POC URINE PREG, ED: Preg Test, Ur: POSITIVE — AB

## 2020-10-19 NOTE — ED Provider Notes (Signed)
[facility] Provider Note    Event Date/Time   First MD Initiated Contact with Patient 10/19/20 1130     (approximate)  I have reviewed the triage vital signs and the nursing notes.   HISTORY  Chief Complaint Vaginal Bleeding    HPI Michelle Monroe is a 20 y.o. female G1P0 Rh positive patient presents to the ER with complaint of vaginal bleeding passing a clot on the 21st.  She was out of town did not seek medical care.  She did have intercourse the day before but not report any discomfort.  Has not had any persistent bleeding is having some cramping discomfort no fevers or chills.    History reviewed. No pertinent past medical history. No family history on file. No past surgical history on file. Patient Active Problem List   Diagnosis Date Noted   Encounter for supervision of normal first pregnancy in first trimester 09/16/2020      Prior to Admission medications   Medication Sig Start Date End Date Taking? Authorizing Provider  aspirin EC 81 MG tablet Take 1 tablet (81 mg total) by mouth daily. Take after 12 weeks for prevention of preeclampsia later in pregnancy 10/01/20   Schuman, Denman George R, MD  fexofenadine-pseudoephedrine (ALLEGRA-D) 60-120 MG 12 hr tablet Take 1 tablet by mouth 2 (two) times daily. Patient not taking: Reported on 08/27/2020 10/04/18   Joni Reining, PA-C  ibuprofen (ADVIL) 800 MG tablet Take 1 tablet (800 mg total) by mouth every 8 (eight) hours as needed for moderate pain. Patient not taking: Reported on 08/27/2020 10/04/18   Joni Reining, PA-C  lidocaine (XYLOCAINE) 2 % solution Use as directed 5 mLs in the mouth or throat every 6 (six) hours as needed for mouth pain. Patient not taking: Reported on 08/27/2020 08/19/18   Joni Reining, PA-C  methylPREDNISolone (MEDROL DOSEPAK) 4 MG TBPK tablet Take Tapered dose as directed Patient not taking: Reported on 08/27/2020 08/19/18   Joni Reining, PA-C   metroNIDAZOLE (FLAGYL) 500 MG tablet Take two tablets by mouth twice a day, for one day.  Or you can take all four tablets at once if you can tolerate it. Patient not taking: Reported on 09/16/2020 09/01/20   Natale Milch, MD  ondansetron (ZOFRAN-ODT) 4 MG disintegrating tablet Take 1 tablet (4 mg total) by mouth every 8 (eight) hours as needed for nausea or vomiting. Patient not taking: Reported on 08/27/2020 08/04/20   Cuthriell, Delorise Royals, PA-C  Prenatal Vit-Fe Fumarate-FA (PRENATAL MULTIVITAMIN) TABS tablet Take 1 tablet by mouth daily at 12 noon.    [provider]    Allergies Patient has no known allergies.    Social History Social History   Tobacco Use   Smoking status: Never   Smokeless tobacco: Never  Substance Use Topics   Drug use: Yes    Types: Marijuana    Review of Systems Patient denies headaches, rhinorrhea, blurry vision, numbness, shortness of breath, chest pain, edema, cough, abdominal pain, nausea, vomiting, diarrhea, dysuria, fevers, rashes or hallucinations unless otherwise stated above in HPI. ____________________________________________   PHYSICAL EXAM:  VITAL SIGNS: Vitals:   10/19/20 1011  BP: 118/69  Pulse: 60  Resp: 20  Temp: 99 F (37.2 C)  SpO2: 99%    Constitutional: Alert and oriented.  Eyes: Conjunctivae are normal.  Head: Atraumatic. Nose: No congestion/rhinnorhea. Mouth/Throat: Mucous membranes are moist.   Neck: No stridor. Painless ROM.  Cardiovascular: Normal rate, regular rhythm.  Grossly normal heart sounds.  Good peripheral circulation. Respiratory: Normal respiratory effort.  No retractions. Lungs CTAB. Gastrointestinal: Soft and nontender. No distention. No abdominal bruits. No CVA tenderness. Genitourinary: deferred Musculoskeletal: No lower extremity tenderness nor edema.  No joint effusions. Neurologic:  Normal speech and language. No gross focal neurologic deficits are appreciated. No facial droop Skin:   Skin is warm, dry and intact. No rash noted. Psychiatric: Mood and affect are normal. Speech and behavior are normal.  ____________________________________________   LABS (all labs ordered are listed, but only abnormal results are displayed)  Results for orders placed or performed during the hospital encounter of 10/19/20 (from the past 24 hour(s))  hCG, quantitative, pregnancy     Status: Abnormal   Collection Time: 10/19/20 10:20 AM  Result Value Ref Range   hCG, Beta Chain, Quant, S 35,731 (H) <5 mIU/mL  POC urine preg, ED     Status: Abnormal   Collection Time: 10/19/20 11:26 AM  Result Value Ref Range   Preg Test, Ur POSITIVE (A) NEGATIVE   ____________________________________________ ____________________________________________  RADIOLOGY  I personally reviewed all radiographic images ordered to evaluate for the above acute complaints and reviewed radiology reports and findings.  These findings were personally discussed with the patient.  Please see medical record for radiology report.  ____________________________________________   PROCEDURES  Procedure(s) performed:  Procedures    Critical Care performed: no ____________________________________________   INITIAL IMPRESSION / ASSESSMENT AND PLAN / ED COURSE  Pertinent labs & imaging results that were available during my care of the patient were reviewed by me and considered in my medical decision making (see chart for details).   DDX: ectopic, aub, subchorionic hematoma, miscarriage  Michelle Monroe is a 20 y.o. who presents to the ED with episode of vaginal bleeding over the weekend pregnancy.  She is well-appearing hemodynamically stable.  She is Rh+.  No bleeding or pain at this time.  Ultrasound is reassuring.  Patient does appear stable and appropriate for outpatient follow-up.     The patient was evaluated in Emergency Department today for the symptoms described in the history of present illness. He/she  was evaluated in the context of the global COVID-19 pandemic, which necessitated consideration that the patient might be at risk for infection with the SARS-CoV-2 virus that causes COVID-19. Institutional protocols and algorithms that pertain to the evaluation of patients at risk for COVID-19 are in a state of rapid change based on information released by regulatory bodies including the CDC and federal and state organizations. These policies and algorithms were followed during the patient's care in the ED.  As part of my medical decision making, I reviewed the following data within the electronic MEDICAL RECORD NUMBER Nursing notes reviewed and incorporated, Labs reviewed, notes from prior ED visits and [place] Controlled Substance Database   ____________________________________________   FINAL CLINICAL IMPRESSION(S) / ED DIAGNOSES  Final diagnoses:  Vaginal bleeding in pregnancy      NEW MEDICATIONS STARTED DURING THIS VISIT:  New Prescriptions   No medications on file     Note:  This document was prepared using Dragon voice recognition software and may include unintentional dictation errors.    Willy Eddy, MD 10/19/20 1325

## 2020-10-19 NOTE — ED Notes (Signed)
See triage note  Presents with some vaginal bleeding  States she had heavier bleeding 2 days ago and passed a clot  Denies any bleeding at present but is having abd cramping

## 2020-10-19 NOTE — ED Triage Notes (Signed)
Pt reports that on the 10/17/2020 she wiped and had a blood clot, has not bleed since. She also has been having lower pelvis and back pain, reports that she could not go to the hospital then because she was in [place].

## 2020-10-22 ENCOUNTER — Ambulatory Visit: Payer: Medicaid Other | Attending: Obstetrics and Gynecology

## 2020-10-23 LAB — GLUCOSE TOLERANCE, 1 HOUR

## 2020-10-26 ENCOUNTER — Other Ambulatory Visit: Payer: Self-pay

## 2020-10-26 ENCOUNTER — Other Ambulatory Visit: Payer: Self-pay | Admitting: Obstetrics and Gynecology

## 2020-10-26 ENCOUNTER — Encounter
Admission: RE | Admit: 2020-10-26 | Discharge: 2020-10-26 | Disposition: A | Discharge status: Home / Self Care | Payer: Medicaid Other | Source: Ambulatory Visit | Attending: Anesthesiology | Admitting: Anesthesiology

## 2020-10-26 DIAGNOSIS — O26879 Cervical shortening, unspecified trimester: Secondary | ICD-10-CM

## 2020-10-26 NOTE — Consult Note (Signed)
[facility] Anesthesia Consultation  Michelle Monroe YQI:[id number] DOB: [birth year] DOA: 10/26/2020 PCP: [facility]   Requesting physician: Dr. Jerene Pitch Date of consultation: 10/26/20 Reason for consultation: Obesity during pregnancy  CHIEF COMPLAINT:  Obesity during pregnancy  HISTORY OF PRESENT ILLNESS: Michelle Monroe  is a 20 y.o. female with a known history of obesity during pregnancy. Denies hx of cardiovascular disease. Denies hx of asthma. Denies personal or family hx of bleeding disorders. This is her first pregnancy.   PAST MEDICAL HISTORY:  No past medical history on file.  PAST SURGICAL HISTORY: No past surgical history on file.  SOCIAL HISTORY:  Social History   Tobacco Use   Smoking status: Never   Smokeless tobacco: Never  Substance Use Topics   Alcohol use: Not on file    FAMILY HISTORY: No family history on file.  DRUG ALLERGIES: No Known Allergies  REVIEW OF SYSTEMS:   RESPIRATORY: No cough, shortness of breath, wheezing.  CARDIOVASCULAR: No chest pain, orthopnea, edema.  HEMATOLOGY: No anemia, easy bruising or bleeding SKIN: No rash or lesion. NEUROLOGIC: No tingling, numbness, weakness.  PSYCHIATRY: No anxiety or depression.   MEDICATIONS AT HOME:  Prior to Admission medications   Medication Sig Start Date End Date Taking? Authorizing Provider  aspirin EC 81 MG tablet Take 1 tablet (81 mg total) by mouth daily. Take after 12 weeks for prevention of preeclampsia later in pregnancy 10/01/20   Schuman, Denman George R, MD  fexofenadine-pseudoephedrine (ALLEGRA-D) 60-120 MG 12 hr tablet Take 1 tablet by mouth 2 (two) times daily. Patient not taking: Reported on 08/27/2020 10/04/18   Joni Reining, PA-C  ibuprofen (ADVIL) 800 MG tablet Take 1 tablet (800 mg total) by mouth every 8 (eight) hours as needed for moderate pain. Patient not taking: Reported on 08/27/2020 10/04/18   Joni Reining, PA-C  lidocaine  (XYLOCAINE) 2 % solution Use as directed 5 mLs in the mouth or throat every 6 (six) hours as needed for mouth pain. Patient not taking: Reported on 08/27/2020 08/19/18   Joni Reining, PA-C  methylPREDNISolone (MEDROL DOSEPAK) 4 MG TBPK tablet Take Tapered dose as directed Patient not taking: Reported on 08/27/2020 08/19/18   Joni Reining, PA-C  metroNIDAZOLE (FLAGYL) 500 MG tablet Take two tablets by mouth twice a day, for one day.  Or you can take all four tablets at once if you can tolerate it. Patient not taking: Reported on 09/16/2020 09/01/20   Natale Milch, MD  ondansetron (ZOFRAN-ODT) 4 MG disintegrating tablet Take 1 tablet (4 mg total) by mouth every 8 (eight) hours as needed for nausea or vomiting. Patient not taking: Reported on 08/27/2020 08/04/20   Cuthriell, Delorise Royals, PA-C  Prenatal Vit-Fe Fumarate-FA (PRENATAL MULTIVITAMIN) TABS tablet Take 1 tablet by mouth daily at 12 noon.    [provider]      PHYSICAL EXAMINATION:   VITAL SIGNS: There were no vitals taken for this visit.  GENERAL:  20 y.o.-year-old patient no acute distress.  HEENT: Head atraumatic, normocephalic. Oropharynx and nasopharynx clear. MP 3, TM distance >3 cm, normal mouth opening, grade 1 upper lip bite LUNGS: No use of accessory muscles of respiration.   EXTREMITIES: No pedal edema, cyanosis, or clubbing.  NEUROLOGIC: normal gait PSYCHIATRIC: The patient is alert and oriented x 3.  SKIN: No obvious rash, lesion, or ulcer.    IMPRESSION AND PLAN:   Michelle Monroe  is a 20 y.o. female presenting with obesity during pregnancy.  BMI is currently 50 at [redacted] weeks gestation.   Airway exam reassuring. Spinal interspaces minimally palpable.   We discussed analgesic options during labor including epidural analgesia. Discussed that in obesity there can be increased difficulty with epidural placement or even failure of successful epidural. We also discussed that even after successful epidural placement  there is increased risk of catheter migration out of the epidural space that would require catheter replacement. Discussed use of epidural vs spinal vs GA if cesarean delivery is required. Discussed increased risk of difficult intubation during pregnancy should an emergency cesarean delivery be required.   We discussed repeat evaluation at 32-34 weeks by anesthesia to determine whether there is a high risk of complications of anesthesia for which we would recommend transfer of OB care to a facility with a higher maternal level of care designation.

## 2020-10-29 ENCOUNTER — Other Ambulatory Visit: Payer: Self-pay

## 2020-10-29 ENCOUNTER — Ambulatory Visit (INDEPENDENT_AMBULATORY_CARE_PROVIDER_SITE_OTHER): Payer: Medicaid Other | Admitting: Obstetrics and Gynecology

## 2020-10-29 VITALS — BP 120/74 | Ht 63.0 in | Wt 273.6 lb

## 2020-10-29 DIAGNOSIS — Z3401 Encounter for supervision of normal first pregnancy, first trimester: Secondary | ICD-10-CM

## 2020-10-29 DIAGNOSIS — O26879 Cervical shortening, unspecified trimester: Secondary | ICD-10-CM

## 2020-10-29 DIAGNOSIS — Z3A16 16 weeks gestation of pregnancy: Secondary | ICD-10-CM

## 2020-10-29 NOTE — Progress Notes (Signed)
Routine Prenatal Care Visit  Subjective  Michelle Monroe is a 20 y.o. G1P0 at 104w5d being seen today for ongoing prenatal care.  She is currently monitored for the following issues for this high-risk pregnancy and has Encounter for supervision of normal first pregnancy in first trimester on their problem list.  ----------------------------------------------------------------------------------- Patient reports she had spotting last week between August 23 and August 29.  She reports that her spotting has now resolved.  She was seen at the emergency room and day ultrasound was performed.   Contractions: Not present. Vag. Bleeding: None.  Movement: Absent. Denies leaking of fluid.  ----------------------------------------------------------------------------------- The following portions of the patient's history were reviewed and updated as appropriate: allergies, current medications, past family history, past medical history, past social history, past surgical history and problem list. Problem list updated.   Objective  There were no vitals taken for this visit. Pregravid weight Pregravid weight not on file Total Weight Gain Not found. Urinalysis:      Fetal Status:     Movement: Absent     General:  Alert, oriented and cooperative. Patient is in no acute distress.  Skin: Skin is warm and dry. No rash noted.   Cardiovascular: Normal heart rate noted  Respiratory: Normal respiratory effort, no problems with respiration noted  Abdomen: Soft, gravid, appropriate for gestational age. Pain/Pressure: Present     Pelvic:  Cervical exam performed        Extremities: Normal range of motion.     Mental Status: Normal mood and affect. Normal behavior. Normal judgment and thought content.     Assessment   20 y.o. G1P0 at [redacted]w[redacted]d by  04/10/2021, by Ultrasound presenting for routine prenatal visit  Plan   pregnancy 1 Problems (from 09/16/20 to present)     Problem Noted Resolved   Encounter for  supervision of normal first pregnancy in first trimester 09/16/2020 by Natale Milch, MD No   Overview Addendum 10/01/2020 10:50 AM by Natale Milch, MD     Nursing Staff Provider  Office Location  [facility] Dating   7 wk [place]  Language  English Anatomy US    Flu Vaccine   Genetic Screen  NIPS: normal xx  TDaP vaccine    Hgb A1C or  GTT Early : prediabetic 1hr:  Third trimester :   Covid  unvaccinated   LAB RESULTS   Rhogam  Not needed Blood Type A/Positive/-- (07/01 1033)   Feeding Plan  Antibody Negative (07/01 1033)  Contraception  Rubella 1.22 (07/01 1033)  Circumcision  RPR Non Reactive (07/01 1033)   Pediatrician   HBsAg Negative (07/01 1033)   Support Person  HIV Non Reactive (07/01 1033)  Prenatal Classes  Varicella Non immune    GBS  (For PCN allergy, check sensitivities)   BTL Consent     VBAC Consent  Pap  2022 NIL    Hgb Electro   Normal    CF      SMA        Obesity in pregnancy pregravid BMI: 48  Trichomonas in pregnancy- Treated ** Test of cure needed in August 2022            Discussed her ER ultrasound findings of a 25 mm cervix and a marginal placental area.  Advised pelvic rest until transvaginal ultrasound next week clarifies her cervical length the location of the placenta.  Speculum exam performed today.  Patient cervix is visually closed.  Thin white discharge.  New  swab collected.  Urine culture sent.  Sterile vaginal exam cervix is digitally closed.  Gestational age appropriate obstetric precautions including but not limited to vaginal bleeding, contractions, leaking of fluid and fetal movement were reviewed in detail with the patient.    Return in about 1 week (around 11/05/2020) for ROB with MD.  Natale Milch MD [facility], [facility] 10/29/2020, 9:35 AM

## 2020-10-29 NOTE — Patient Instructions (Addendum)
Second Trimester of Pregnancy The second trimester of pregnancy is from week 13 through week 27. This is months 4 through 6 of pregnancy. The second trimester is often a time when you feel your best. Your body has adjusted to being pregnant, and you begin to feel better physically. During the second trimester: Morning sickness has lessened or stopped completely. You may have more energy. You may have an increase in appetite. The second trimester is also a time when the unborn baby (fetus) is growing rapidly. At the end of the sixth month, the fetus may be up to 12 inches long and weigh about 1 pounds. You will likely begin to feel the baby move (quickening) between 16 and 20 weeks of pregnancy. Body changes during your second trimester Your body continues to go through many changes during your second trimester. The changes vary and generally return to normal after the baby is born. Physical changes Your weight will continue to increase. You will notice your lower abdomen bulging out. You may begin to get stretch marks on your hips, abdomen, and breasts. Your breasts will continue to grow and to become tender. Dark spots or blotches (chloasma or mask of pregnancy) may develop on your face. A dark line from your belly button to the pubic area (linea nigra) may appear. You may have changes in your hair. These can include thickening of your hair, rapid growth, and changes in texture. Some people also have hair loss during or after pregnancy, or hair that feels dry or thin. Health changes You may develop headaches. You may have heartburn. You may develop constipation. You may develop hemorrhoids or swollen, bulging veins (varicose veins). Your gums may bleed and may be sensitive to brushing and flossing. You may urinate more often because the fetus is pressing on your bladder. You may have back pain. This is caused by: Weight gain. Pregnancy hormones that are relaxing the joints in your  pelvis. A shift in weight and the muscles that support your balance. Follow these instructions at home: Medicines Follow your health care provider's instructions regarding medicine use. Specific medicines may be either safe or unsafe to take during pregnancy. Do not take any medicines unless approved by your health care provider. Take a prenatal vitamin that contains at least 600 micrograms (mcg) of folic acid. Eating and drinking Eat a healthy diet that includes fresh fruits and vegetables, whole grains, good sources of protein such as meat, eggs, or tofu, and low-fat dairy products. Avoid raw meat and unpasteurized juice, milk, and cheese. These carry germs that can harm you and your baby. You may need to take these actions to prevent or treat constipation: Drink enough fluid to keep your urine pale yellow. Eat foods that are high in fiber, such as beans, whole grains, and fresh fruits and vegetables. Limit foods that are high in fat and processed sugars, such as fried or sweet foods. Activity Exercise only as directed by your health care provider. Most people can continue their usual exercise routine during pregnancy. Try to exercise for 30 minutes at least 5 days a week. Stop exercising if you develop contractions in your uterus. Stop exercising if you develop pain or cramping in the lower abdomen or lower back. Avoid exercising if it is very hot or humid or if you are at a high altitude. Avoid heavy lifting. If you choose to, you may have sex unless your health care provider tells you not to. Relieving pain and discomfort Wear a supportive bra  to prevent discomfort from breast tenderness. Take warm sitz baths to soothe any pain or discomfort caused by hemorrhoids. Use hemorrhoid cream if your health care provider approves. Rest with your legs raised (elevated) if you have leg cramps or low back pain. If you develop varicose veins: Wear support hose as told by your health care  provider. Elevate your feet for 15 minutes, 3-4 times a day. Limit salt in your diet. Safety Wear your seat belt at all times when driving or riding in a car. Talk with your health care provider if someone is verbally or physically abusive to you. Lifestyle Do not use hot tubs, steam rooms, or saunas. Do not douche. Do not use tampons or scented sanitary pads. Avoid cat litter boxes and soil used by cats. These carry germs that can cause birth defects in the baby and possibly loss of the fetus by miscarriage or stillbirth. Do not use herbal remedies, alcohol, illegal drugs, or medicines that are not approved by your health care provider. Chemicals in these products can harm your baby. Do not use any products that contain nicotine or tobacco, such as cigarettes, e-cigarettes, and chewing tobacco. If you need help quitting, ask your health care provider. General instructions During a routine prenatal visit, your health care provider will do a physical exam and other tests. He or she will also discuss your overall health. Keep all follow-up visits. This is important. Ask your health care provider for a referral to a local prenatal education class. Ask for help if you have counseling or nutritional needs during pregnancy. Your health care provider can offer advice or refer you to specialists for help with various needs. Where to find more information American Pregnancy Association: americanpregnancy.org [employer]st[employer]ians [employer] Gynecologists: https://www.todd-brady.net/ Office on [employer] Health: MightyReward.co.nz Contact a health care provider if you have: A headache that does not go away when you take medicine. Vision changes or you see spots in front of your eyes. Mild pelvic cramps, pelvic pressure, or nagging pain in the abdominal area. Persistent nausea, vomiting, or diarrhea. A bad-smelling vaginal discharge or foul-smelling urine. Pain when you  urinate. Sudden or extreme swelling of your face, hands, ankles, feet, or legs. A fever. Get help right away if you: Have fluid leaking from your vagina. Have spotting or bleeding from your vagina. Have severe abdominal cramping or pain. Have difficulty breathing. Have chest pain. Have fainting spells. Have not felt your baby move for the time period told by your health care provider. Have new or increased pain, swelling, or redness in an arm or leg. Summary The second trimester of pregnancy is from week 13 through week 27 (months 4 through 6). Do not use herbal remedies, alcohol, illegal drugs, or medicines that are not approved by your health care provider. Chemicals in these products can harm your baby. Exercise only as directed by your health care provider. Most people can continue their usual exercise routine during pregnancy. Keep all follow-up visits. This is important. This information is not intended to replace advice given to you by your health care provider. Make sure you discuss any questions you have with your health care provider. Document Revised: 07/23/2019 Document Reviewed: 05/29/2019 Elsevier Patient Education  2022 Elsevier Inc. Activity Restriction During Pregnancy Your health care provider may recommend specific activity restrictions during pregnancy for a variety of reasons. Activity restriction may require that you limit activities that require great effort, such as exercise, lifting, or sex. The type of activity restriction will  vary for each person, depending on your risk or the problems you are having. Activity restriction may be recommended for a period of time until your baby is delivered. Why are activity restrictions recommended? Activity restriction may be recommended if: Your placenta is partially or completely covering the opening of your cervix (placenta previa). There is bleeding between the wall of the uterus and the amniotic sac in the first trimester  of pregnancy (subchorionic hemorrhage). You went into labor too early (preterm labor). You have a history of miscarriage. You have a condition that causes high blood pressure during pregnancy (preeclampsia or eclampsia). You are pregnant with more than one baby. Your baby is not growing well. What are the risks? The risks depend on your specific restriction. Strict bed rest has the most physical and emotional risks and is no longer routinely recommended. Risks of strict bed rest include: Loss of muscle conditioning from not moving. Blood clots. Social isolation. Depression. Loss of income. Talk with your health care team about activity restriction to decide if it is best for you and your baby. Even if you are having problems during your pregnancy, you may be able to continue with normal levels of activity with careful monitoring by your health care team. Follow these instructions at home: If needed, based on your overall health and the health of your baby, your health care provider will decide which type of activity restriction is right for you. Activity restrictions may include: Not lifting anything heavier than 10 pounds (4.5 kg). Avoiding activities that take a lot of physical effort. No lifting or straining. Resting in a sitting position or lying down for periods of time during the day. Pelvic rest may be recommended along with activity restrictions. If pelvic rest is recommended, then: Do not have sex, an orgasm, or use sexual stimulation. Do not use tampons. Do not douche. Do not put anything into your vagina. Do not lift anything that is heavier than 10 lb (4.5 kg). Avoid activities that require a lot of effort. Avoid any activity in which your pelvic muscles could become strained, such as squatting. Questions to ask your health care provider Why is my activity being limited? How will activity restrictions affect my body? Why is rest helpful for me and my baby? What activities  can I do? When can I return to normal activities? When should I seek immediate medical care? Seek immediate medical care if you have: Vaginal bleeding. Vaginal discharge. Cramping pain in your lower abdomen. Regular contractions. A low, dull backache. Summary Your health care provider may recommend specific activity restrictions during pregnancy for a variety of reasons. Activity restriction may require that you limit activities such as exercise, lifting, sex, or any other activity that requires great effort. Discuss the risks and benefits of activity restriction with your health care team to decide if it is best for you and your baby. Contact your health care provider right away if you think you are having contractions, or if you notice vaginal bleeding, discharge, or cramping. This information is not intended to replace advice given to you by your health care provider. Make sure you discuss any questions you have with your health care provider. Document Revised: 11/06/2018 Document Reviewed: 06/05/2017 Elsevier Patient Education  2022 [employer].

## 2020-11-01 LAB — URINE CULTURE

## 2020-11-02 ENCOUNTER — Other Ambulatory Visit: Payer: Self-pay | Admitting: Obstetrics and Gynecology

## 2020-11-02 ENCOUNTER — Ambulatory Visit: Payer: Medicaid Other | Attending: Maternal & Fetal Medicine

## 2020-11-02 ENCOUNTER — Other Ambulatory Visit: Payer: Self-pay

## 2020-11-02 ENCOUNTER — Other Ambulatory Visit: Payer: Self-pay | Admitting: Maternal & Fetal Medicine

## 2020-11-02 VITALS — BP 116/82 | HR 105 | Temp 98.6°F | Ht 62.0 in | Wt 268.0 lb

## 2020-11-02 DIAGNOSIS — E669 Obesity, unspecified: Secondary | ICD-10-CM | POA: Diagnosis not present

## 2020-11-02 DIAGNOSIS — O26879 Cervical shortening, unspecified trimester: Secondary | ICD-10-CM

## 2020-11-02 DIAGNOSIS — Z3401 Encounter for supervision of normal first pregnancy, first trimester: Secondary | ICD-10-CM

## 2020-11-02 DIAGNOSIS — N760 Acute vaginitis: Secondary | ICD-10-CM

## 2020-11-02 DIAGNOSIS — O26872 Cervical shortening, second trimester: Secondary | ICD-10-CM | POA: Diagnosis present

## 2020-11-02 DIAGNOSIS — Z3A17 17 weeks gestation of pregnancy: Secondary | ICD-10-CM | POA: Diagnosis not present

## 2020-11-02 DIAGNOSIS — O99212 Obesity complicating pregnancy, second trimester: Secondary | ICD-10-CM

## 2020-11-02 DIAGNOSIS — O4690 Antepartum hemorrhage, unspecified, unspecified trimester: Secondary | ICD-10-CM

## 2020-11-02 DIAGNOSIS — O4692 Antepartum hemorrhage, unspecified, second trimester: Secondary | ICD-10-CM | POA: Insufficient documentation

## 2020-11-02 LAB — NUSWAB VAGINITIS PLUS (VG+)
Atopobium vaginae: HIGH Score — AB
BVAB 2: HIGH Score — AB
Candida albicans, NAA: NEGATIVE
Candida glabrata, NAA: NEGATIVE
Chlamydia trachomatis, NAA: NEGATIVE
Megasphaera 1: HIGH Score — AB
Neisseria gonorrhoeae, NAA: NEGATIVE
Trich vag by NAA: NEGATIVE

## 2020-11-02 MED ORDER — METRONIDAZOLE 0.75 % VA GEL: 1.0000 | Freq: Every day | VAGINAL | 1 refills | Status: DC

## 2020-11-02 NOTE — Patient Instructions (Signed)
Michelle Monroe was seen at the MFM Clinic today at [facility]  She was cleared by Dr. Grace Bushy , MFM to return to work. Roxy Horseman, RN

## 2020-11-05 ENCOUNTER — Encounter: Payer: Self-pay | Admitting: Obstetrics and Gynecology

## 2020-11-05 ENCOUNTER — Ambulatory Visit (INDEPENDENT_AMBULATORY_CARE_PROVIDER_SITE_OTHER): Payer: Medicaid Other | Admitting: Obstetrics and Gynecology

## 2020-11-05 ENCOUNTER — Other Ambulatory Visit: Payer: Medicaid Other

## 2020-11-05 ENCOUNTER — Other Ambulatory Visit: Payer: Self-pay

## 2020-11-05 VITALS — BP 130/70 | Ht 63.0 in | Wt 276.4 lb

## 2020-11-05 DIAGNOSIS — Z3401 Encounter for supervision of normal first pregnancy, first trimester: Secondary | ICD-10-CM

## 2020-11-05 DIAGNOSIS — R7303 Prediabetes: Secondary | ICD-10-CM

## 2020-11-05 DIAGNOSIS — Z3A17 17 weeks gestation of pregnancy: Secondary | ICD-10-CM

## 2020-11-05 DIAGNOSIS — Z3402 Encounter for supervision of normal first pregnancy, second trimester: Secondary | ICD-10-CM

## 2020-11-05 NOTE — Progress Notes (Signed)
Routine Prenatal Care Visit  Subjective  Michelle Monroe is a 20 y.o. G1P0 at [redacted]w[redacted]d being seen today for ongoing prenatal care.  She is currently monitored for the following issues for this low-risk pregnancy and has Encounter for supervision of normal first pregnancy in first trimester on their problem list.  ----------------------------------------------------------------------------------- Patient reports no complaints.   She reports that she has not started her MetroGel for bacterial vaginosis yet.  She just received the money to do so.  She is planning on picking up the medicine.  She was seen by MFM this week for possible short ultrasound but her cervical length was reassuring.  She did not have evidence of a previa. Contractions: Not present. Vag. Bleeding: None.  Movement: Absent. Denies leaking of fluid.  ----------------------------------------------------------------------------------- The following portions of the patient's history were reviewed and updated as appropriate: allergies, current medications, past family history, past medical history, past social history, past surgical history and problem list. Problem list updated.   Objective  Blood pressure 130/70, height 5\' 3"  (1.6 m), weight 276 lb 6.4 oz (125.4 kg). Pregravid weight Pregravid weight not on file Total Weight Gain Not found. Urinalysis:      Fetal Status: Fetal Heart Rate (bpm): 140   Movement: Absent     General:  Alert, oriented and cooperative. Patient is in no acute distress.  Skin: Skin is warm and dry. No rash noted.   Cardiovascular: Normal heart rate noted  Respiratory: Normal respiratory effort, no problems with respiration noted  Abdomen: Soft, gravid, appropriate for gestational age. Pain/Pressure: Absent     Pelvic:  Cervical exam deferred        Extremities: Normal range of motion.  Edema: None  Mental Status: Normal mood and affect. Normal behavior. Normal judgment and thought content.      Assessment   20 y.o. G1P0 at [redacted]w[redacted]d by  04/10/2021, by Ultrasound presenting for routine prenatal visit  Plan   pregnancy 1 Problems (from 09/16/20 to present)     Problem Noted Resolved   Encounter for supervision of normal first pregnancy in first trimester 09/16/2020 by 09/18/2020, MD No   Overview Addendum 10/29/2020  9:37 AM by 12/29/2020, MD     Nursing Staff Provider  Office Location  [facility] Dating   7 wk Natale Milch  Language  English Anatomy US    Flu Vaccine   Genetic Screen  NIPS: normal xx  TDaP vaccine    Hgb A1C or  GTT Early : prediabetic 1hr:  Third trimester :   Covid  unvaccinated   LAB RESULTS   Rhogam  Not needed Blood Type A/Positive/-- (07/01 1033)   Feeding Plan  Antibody Negative (07/01 1033)  Contraception  Rubella 1.22 (07/01 1033)  Circumcision  RPR Non Reactive (07/01 1033)   Pediatrician   HBsAg Negative (07/01 1033)   Support Person  HIV Non Reactive (07/01 1033)  Prenatal Classes  Varicella Non immune    GBS  (For PCN allergy, check sensitivities)   BTL Consent     VBAC Consent  Pap  2022 NIL    Hgb Electro   Normal    CF      SMA        Obesity in pregnancy pregravid BMI: 48  Trichomonas in pregnancy- Treated         1 hour GTT today  Gestational age appropriate obstetric precautions including but not limited to vaginal bleeding, contractions, leaking of fluid and fetal  movement were reviewed in detail with the patient.    Return in about 2 weeks (around 11/19/2020) for ROB after anatomy US.  Natale Milch MD [facility], [facility] 11/05/2020, 9:57 AM

## 2020-11-06 LAB — GLUCOSE TOLERANCE, 1 HOUR: Glucose, 1Hr PP: 102 mg/dL (ref 65–199)

## 2020-11-12 ENCOUNTER — Telehealth: Payer: Self-pay

## 2020-11-12 NOTE — Telephone Encounter (Signed)
Pt calling; throws up everything she eats.  [phone]  VM not set up yet.

## 2020-11-15 NOTE — Telephone Encounter (Signed)
"  Not accepting calls at this time."

## 2020-11-17 NOTE — Telephone Encounter (Signed)
Pt states she is now keeping fluids and solids down.

## 2020-11-18 ENCOUNTER — Other Ambulatory Visit: Payer: Self-pay

## 2020-11-18 ENCOUNTER — Ambulatory Visit: Payer: Medicaid Other | Attending: Obstetrics and Gynecology

## 2020-11-18 DIAGNOSIS — Z363 Encounter for antenatal screening for malformations: Secondary | ICD-10-CM | POA: Insufficient documentation

## 2020-11-18 DIAGNOSIS — O99212 Obesity complicating pregnancy, second trimester: Secondary | ICD-10-CM | POA: Insufficient documentation

## 2020-11-18 DIAGNOSIS — O358XX0 Maternal care for other (suspected) fetal abnormality and damage, not applicable or unspecified: Secondary | ICD-10-CM

## 2020-11-18 DIAGNOSIS — Z3A19 19 weeks gestation of pregnancy: Secondary | ICD-10-CM | POA: Diagnosis not present

## 2020-11-18 DIAGNOSIS — O26872 Cervical shortening, second trimester: Secondary | ICD-10-CM

## 2020-11-18 DIAGNOSIS — O26879 Cervical shortening, unspecified trimester: Secondary | ICD-10-CM

## 2020-11-18 DIAGNOSIS — Z3689 Encounter for other specified antenatal screening: Secondary | ICD-10-CM | POA: Diagnosis not present

## 2020-11-19 ENCOUNTER — Encounter: Payer: Medicaid Other | Admitting: Obstetrics & Gynecology

## 2020-11-25 ENCOUNTER — Ambulatory Visit (INDEPENDENT_AMBULATORY_CARE_PROVIDER_SITE_OTHER): Payer: Medicaid Other | Admitting: Obstetrics & Gynecology

## 2020-11-25 ENCOUNTER — Other Ambulatory Visit: Payer: Self-pay

## 2020-11-25 ENCOUNTER — Encounter: Payer: Self-pay | Admitting: Obstetrics & Gynecology

## 2020-11-25 VITALS — BP 120/80 | Wt 271.0 lb

## 2020-11-25 DIAGNOSIS — Z3402 Encounter for supervision of normal first pregnancy, second trimester: Secondary | ICD-10-CM

## 2020-11-25 DIAGNOSIS — Z3A20 20 weeks gestation of pregnancy: Secondary | ICD-10-CM

## 2020-11-25 DIAGNOSIS — Z3401 Encounter for supervision of normal first pregnancy, first trimester: Secondary | ICD-10-CM

## 2020-11-25 DIAGNOSIS — Z131 Encounter for screening for diabetes mellitus: Secondary | ICD-10-CM

## 2020-11-25 NOTE — Patient Instructions (Signed)

## 2020-11-25 NOTE — Addendum Note (Signed)
Addended by: Nadara Mustard on: 11/25/2020 02:19 PM   Modules accepted: Orders

## 2020-11-25 NOTE — Progress Notes (Signed)
  Subjective  Fetal Movement? yes Contractions? no Leaking Fluid? no Vaginal Bleeding? no Nausea? mild Objective  BP 120/80   Wt 271 lb (122.9 kg)   LMP  (LMP Unknown)   BMI 49.57 kg/m  General: NAD Pumonary: no increased work of breathing Abdomen: gravid, non-tender Extremities: no edema Psychiatric: mood appropriate, affect full  Assessment  20 y.o. G1P0 at [redacted]w[redacted]d by  04/10/2021, by Ultrasound presenting for routine prenatal visit  Plan   Problem List Items Addressed This Visit      Other   Encounter for supervision of normal first pregnancy in first trimester  Other Visit Diagnoses    Encounter for supervision of normal first pregnancy in second trimester    -  Primary   [redacted] weeks gestation of pregnancy         pregnancy 1 Problems (from 09/16/20 to present)    Problem Noted Resolved   Encounter for supervision of normal first pregnancy in first trimester 09/16/2020 by Natale Milch, MD No   Overview Addendum 11/25/2020  2:12 PM by Nadara Mustard, MD     Nursing Staff Provider  Office Location  [facility] Dating   7 wk [place]  Language  English Anatomy US  MFM nml  Flu Vaccine  Decl Genetic Screen  NIPS: normal xx  TDaP vaccine    Hgb A1C or  GTT Early : prediabetic 1hr: 102 Third trimester :   Covid unvaccinated   LAB RESULTS   Rhogam  Not needed Blood Type A/Positive/-- (07/01 1033)   Feeding Plan Unsure Antibody Negative (07/01 1033)  Contraception PILL Rubella 1.22 (07/01 1033)  Circumcision N/A RPR Non Reactive (07/01 1033)   Pediatrician   HBsAg Negative (07/01 1033)   Support Person  HIV Non Reactive (07/01 1033)  Prenatal Classes  Varicella Non immune    GBS  (For PCN allergy, check sensitivities)   BTL Consent NO    VBAC Consent N/A Pap  2022 NIL    Hgb Electro   Normal   Obesity in pregnancy pregravid BMI: 48  Trichomonas in pregnancy- Treated, TOC neg 8/22       The following were addressed during this visit:  Breastfeeding Education -  Early initiation of breastfeeding  - The importance of exclusive breastfeeding  - Risks of giving your baby anything other than breast milk if you are breastfeeding  - Nonpharmacological pain relief methods for labor  - The importance of early skin-to-skin contact  - Rooming-in on a 24-hour basis  - Feeding on demand or baby-led feeding  - Frequent feeding to help assure optimal milk production  - Effective positioning and attachment  - Exclusive breastfeeding for the first 6 months  - Individualized Education   18-21 weeks - Ultrasound  Discussed results. Echogenic foci, isolated, and its risks and meaning discussed F/u planned at MFM   PNV Desires OCP or POP for birth control; options discussed Obesity risk factors.  ASA.  Growth [place].  APT.   [x ] Early diabetes screening for BMI>30 (normal) Antenatal Testing planned: [x ] BMI >40: Weekly at 34 weeks Anesthesia consult  Annamarie Major, MD, [facility], [facility] 11/25/2020  2:13 PM

## 2020-12-17 NOTE — Progress Notes (Signed)
Sleep Medicine   Office Visit  Patient Name: Michelle Monroe DOB: 2000/08/04 MRN [id number]    Chief Complaint: initial evaluation  Brief History:  No studies on file,  Michelle Monroe presents [redacted] weeks pregnant, with a 3 month history of symptoms and is here for initial evaluation for sleep apnea. This is neither better or worse in any position.   Sleep quality is very poor and stated aunt said she stops breathing most nights and she starts kicking legs to breath again.. This is noted The patient relates the following symptoms: loud snoring, kicking & waking up tired most of the time. are also present. 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  she reports that her sleep quality is poor.  Sleep quality is similar when outside home environment.  Patient has noted kicking of her legs at night.  The patient  relates no unusual behavior during the night.  The patient does not relate  a history of psychiatric problems. The Epworth Sleepiness Score is 5 out of 24 .  The patient relates  Cardiovascular risk factors include: none   ROS  General: (-) fever, (-) chills, (-) night sweat Nose and Sinuses: (-) nasal stuffiness or itchiness, (-) postnasal drip, (-) nosebleeds, (-) sinus trouble. Mouth and Throat: (-) sore throat, (-) hoarseness. Neck: (-) swollen glands, (-) enlarged thyroid, (-) neck pain. Respiratory: - cough, - shortness of breath, - wheezing. Neurologic: - numbness, - tingling. Psychiatric: - anxiety, - depression Sleep behavior: -sleep paralysis -hypnogogic hallucinations -dream enactment      -vivid dreams -cataplexy -night terrors -sleep walking   Current Medication: Outpatient Encounter Medications as of 12/20/2020  Medication Sig   aspirin EC 81 MG tablet Take 1 tablet (81 mg total) by mouth daily. Take after 12 weeks for prevention of preeclampsia later in pregnancy   Prenatal Vit-Fe Fumarate-FA (PRENATAL MULTIVITAMIN) TABS tablet Take 1 tablet by mouth daily at 12 noon.    No facility-administered encounter medications on file as of 12/20/2020.    Surgical History: History reviewed. No pertinent surgical history.  Medical History: History reviewed. No pertinent past medical history.  Family History: Non contributory to the present illness  Social History: Social History   Socioeconomic History   Marital status: Single    Spouse name: Not on file   Number of children: Not on file   Years of education: Not on file   Highest education level: Not on file  Occupational History   Not on file  Tobacco Use   Smoking status: Never   Smokeless tobacco: Never  Vaping Use   Vaping Use: Never used  Substance and Sexual Activity   Alcohol use: Not Currently   Drug use: Not Currently    Types: Marijuana   Sexual activity: Not Currently  Other Topics Concern   Not on file  Social History Narrative   Not on file   Social Determinants of Health   Financial Resource Strain: Not on file  Food Insecurity: Not on file  Transportation Needs: Not on file  Physical Activity: Not on file  Stress: Not on file  Social Connections: Not on file  Intimate Partner Violence: Not on file    Vital Signs: Blood pressure (!) 121/94, pulse 92, resp. rate 16, height 5\' 2"  (1.575 m), weight 262 lb 1.6 oz (118.9 kg), SpO2 99 %.  Examination: General Appearance: The patient is well-developed, well-nourished, and in no distress. Neck Circumference: 38 cm Skin: Gross inspection of skin unremarkable. Head:  normocephalic, no gross deformities. Eyes: no gross deformities noted. ENT: ears appear grossly normal Neurologic: Alert and oriented. No involuntary movements.    EPWORTH SLEEPINESS SCALE:  Scale:  (0)= no chance of dozing; (1)= slight chance of dozing; (2)= moderate chance of dozing; (3)= high chance of dozing  Chance  Situtation    Sitting and reading: 1    Watching TV: 1    Sitting Inactive in public: 0    As a passenger in car: 0      Lying  down to rest: 2    Sitting and talking: 0    Sitting quielty after lunch: 0    In a car, stopped in traffic: 0   TOTAL SCORE:   5 out of 24    SLEEP STUDIES:  No studies on file   LABS: Recent Results (from the past 2160 hour(s))  Glucose tolerance, 1 hour     Status: None   Collection Time: 10/01/20 10:17 AM  Result Value Ref Range   Glucose, 1Hr PP CANCELED mg/dL    Comment: [facility] was unable to collect sufficient specimen to perform the following test(s), and is providing the patient with re-collection instructions. This is a corrected report. The previously reported result was: Glucose, 1Hr PP          149           mg/dL           36/14/4315  Result canceled by the ancillary.   Cervicovaginal ancillary only     Status: None   Collection Time: 10/01/20 10:39 AM  Result Value Ref Range   Neisseria Gonorrhea Negative    Chlamydia Negative    Trichomonas Negative    Comment Normal Reference Range Trichomonas - Negative    Comment Normal Reference Ranger Chlamydia - Negative    Comment      Normal Reference Range Neisseria Gonorrhea - Negative  hCG, quantitative, pregnancy     Status: Abnormal   Collection Time: 10/19/20 10:20 AM  Result Value Ref Range   hCG, Beta Chain, Quant, S 35,731 (H) <5 mIU/mL    Comment:          GEST. AGE      CONC.  (mIU/mL)   <=1 WEEK        5 - 50     2 WEEKS       50 - 500     3 WEEKS       100 - 10,000     4 WEEKS     1,000 - 30,000     5 WEEKS     3,500 - 115,000   6-8 WEEKS     12,000 - 270,000    12 WEEKS     15,000 - 220,000        FEMALE AND NON-PREGNANT FEMALE:     LESS THAN 5 mIU/mL Performed at [facility], [address] [place], [place] [address]   POC urine preg, ED     Status: Abnormal   Collection Time: 10/19/20 11:26 AM  Result Value Ref Range   Preg Test, Ur POSITIVE (A) NEGATIVE    Comment:        THE SENSITIVITY OF THIS METHODOLOGY IS >24 mIU/mL   Urine Culture     Status: None    Collection Time: 10/29/20  9:39 AM   Specimen: Urine   UR  Result Value Ref Range   Urine Culture, Routine Final report  Organism ID, Bacteria Comment     Comment: Mixed urogenital flora 25,000-50,000 colony forming units per mL   NuSwab Vaginitis Plus (VG+)     Status: Abnormal   Collection Time: 10/29/20  9:39 AM  Result Value Ref Range   Atopobium vaginae High - 2 (A) Score   BVAB 2 High - 2 (A) Score   Megasphaera 1 High - 2 (A) Score    Comment: Calculate total score by adding the 3 individual bacterial vaginosis (BV) marker scores together.  Total score is interpreted as follows: Total score 0-1: Indicates the absence of BV. Total score   2: Indeterminate for BV. Additional clinical                  data should be evaluated to establish a                  diagnosis. Total score 3-6: Indicates the presence of BV. This test was developed and its performance characteristics determined by [facility].  It has not been cleared or approved by the Food and Drug Administration.    Candida albicans, NAA Negative Negative   Candida glabrata, NAA Negative Negative   Trich vag by NAA Negative Negative   Chlamydia trachomatis, NAA Negative Negative   Neisseria gonorrhoeae, NAA Negative Negative  Glucose tolerance, 1 hour     Status: None   Collection Time: 11/05/20  9:39 AM  Result Value Ref Range   Glucose, 1Hr PP 102 65 - 199 mg/dL    Radiology: No results found.  No results found.  No results found.    Assessment and Plan: Patient Active Problem List   Diagnosis Date Noted   OSA (obstructive sleep apnea) 12/20/2020   Intrauterine pregnancy 12/20/2020   Morbid obesity (HCC) 12/20/2020   Encounter for supervision of normal first pregnancy in first trimester 09/16/2020   1. OSA (obstructive sleep apnea) PLAN OSA:   Patient evaluation suggests high risk of sleep disordered breathing due to observed apnea, snoring, BMI over 40, daytime somnolence. Suggest: PSG to assess  the patient's sleep disordered breathing. The patient was also counselled on weight loss when she is able (currently pregnant)  to optimize sleep health.   2. Intrauterine pregnancy Continue f/u with ob/gyn  3. Morbid obesity (HCC) Obesity Counseling: Had a lengthy discussion regarding patients BMI and weight issues. Patient was instructed on portion control as well as increased activity. Also discussed caloric restrictions with trying to maintain intake less than 2000 Kcal. Discussions were made in accordance with the 5As of weight management. Simple actions such as not eating late and if able to, taking a walk is suggested.   i General Counseling: I have discussed the findings of the evaluation and examination with Michelle Monroe.  I have also discussed any further diagnostic evaluation thatmay be needed or ordered today. Michelle Monroe verbalizes understanding of the findings of todays visit. We also reviewed her medications today and discussed drug interactions and side effects including but not limited excessive drowsiness and altered mental states. We also discussed that there is always a risk not just to her but also people around her. she has been encouraged to call the office with any questions or concerns that should arise related to todays visit.  No orders of the defined types were placed in this encounter.       I have personally obtained a history, evaluated the patient, evaluated pertinent data, formulated the assessment and plan and placed orders.   This patient  was seen today by Emmaline Kluver, PA-C in collaboration with Dr. Freda Munro.   Yevonne Pax, MD [facility] Diplomate ABMS Pulmonary and Critical Care Medicine Sleep medicine

## 2020-12-20 ENCOUNTER — Other Ambulatory Visit: Payer: Self-pay

## 2020-12-20 ENCOUNTER — Ambulatory Visit (INDEPENDENT_AMBULATORY_CARE_PROVIDER_SITE_OTHER): Payer: Medicaid Other | Admitting: Internal Medicine

## 2020-12-20 VITALS — BP 121/94 | HR 92 | Resp 16 | Ht 62.0 in | Wt 262.1 lb

## 2020-12-20 DIAGNOSIS — Z3490 Encounter for supervision of normal pregnancy, unspecified, unspecified trimester: Secondary | ICD-10-CM | POA: Diagnosis not present

## 2020-12-20 DIAGNOSIS — G4733 Obstructive sleep apnea (adult) (pediatric): Secondary | ICD-10-CM | POA: Diagnosis not present

## 2020-12-23 ENCOUNTER — Other Ambulatory Visit: Payer: Self-pay

## 2020-12-23 ENCOUNTER — Observation Stay
Admission: EM | Admit: 2020-12-23 | Discharge: 2020-12-23 | Disposition: A | Discharge status: Home / Self Care | Payer: Medicaid Other | Attending: Obstetrics and Gynecology | Admitting: Obstetrics and Gynecology

## 2020-12-23 ENCOUNTER — Encounter: Payer: Self-pay | Admitting: Obstetrics and Gynecology

## 2020-12-23 DIAGNOSIS — O368120 Decreased fetal movements, second trimester, not applicable or unspecified: Principal | ICD-10-CM | POA: Insufficient documentation

## 2020-12-23 DIAGNOSIS — Z3401 Encounter for supervision of normal first pregnancy, first trimester: Secondary | ICD-10-CM

## 2020-12-23 DIAGNOSIS — Z3A24 24 weeks gestation of pregnancy: Secondary | ICD-10-CM | POA: Diagnosis not present

## 2020-12-23 NOTE — Progress Notes (Signed)
RN went into room to discharge pt. Pt given discharge instructions and follow up care. Pt asked "what about me passing out?" Pt now reports that she almost passed out this morning at work. 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 Pt reports she did not pass out. Pt reports this is the third episode in the last week.

## 2020-12-23 NOTE — Discharge Summary (Signed)
Physician Final Progress Note  Patient ID: Michelle Monroe MRN: [id number] DOB/AGE: 2001/02/05 20 y.o.  Admit date: 12/23/2020 Admitting provider: Vena Austria, MD Discharge date: 12/23/2020   Admission Diagnoses: decreased fetal movement  Discharge Diagnoses:  Active Problems:   Indication for care in labor and delivery, antepartum   20 y.o. G1P0 at [redacted]w[redacted]d by Estimated Date of Delivery: 04/10/21 presenting for decreased fetal movement past two weeks, actually reports good movement today again.  She was monitored and after obtaining reactive NST discharge then asked about her passing out.  Has not passed out but felt dizzy.  I was in the OR with another case and when arrived to evlaute patient she had eloped.    Consults: None  Significant Findings/ Diagnostic Studies: none  Procedures: NST  Discharge Condition: good  Disposition: Discharge disposition: 01-Home or Self Care       Diet: Regular diet  Discharge Activity: Activity as tolerated   Allergies as of 12/23/2020   No Known Allergies      Medication List     ASK your doctor about these medications    aspirin EC 81 MG tablet Take 1 tablet (81 mg total) by mouth daily. Take after 12 weeks for prevention of preeclampsia later in pregnancy   prenatal multivitamin Tabs tablet Take 1 tablet by mouth daily at 12 noon.         Total time spent taking care of this patient: eloped prior to evaluation  Signed: Vena Austria 12/23/2020, 10:03 AM

## 2020-12-23 NOTE — OB Triage Note (Addendum)
Pt G1P0 [redacted]w[redacted]d presents with c/o of deceased FM for 2 weeks however reports good FM since this morning. Pt reports during those 2 weeks she still felt movement but wasn't as "strong". Denies LOF/bleeding/CTX. VSS. Pt reports no other concerns at this time. Monitors applied.

## 2020-12-23 NOTE — Progress Notes (Signed)
Pt notified at 0940 that Dr. Bonney Aid will be in to see her shortly. At 1000 Dr. Bonney Aid went into room to evaluate pt and pt had already left the room.

## 2020-12-28 ENCOUNTER — Other Ambulatory Visit: Payer: Self-pay

## 2020-12-28 DIAGNOSIS — O283 Abnormal ultrasonic finding on antenatal screening of mother: Secondary | ICD-10-CM

## 2020-12-28 DIAGNOSIS — O99212 Obesity complicating pregnancy, second trimester: Secondary | ICD-10-CM

## 2020-12-30 ENCOUNTER — Ambulatory Visit: Payer: Medicaid Other | Attending: Obstetrics

## 2020-12-30 ENCOUNTER — Other Ambulatory Visit: Payer: Self-pay

## 2020-12-30 VITALS — BP 105/80 | HR 95 | Temp 98.8°F | Ht 62.0 in | Wt 268.5 lb

## 2020-12-30 DIAGNOSIS — Z3A25 25 weeks gestation of pregnancy: Secondary | ICD-10-CM | POA: Insufficient documentation

## 2020-12-30 DIAGNOSIS — O358XX0 Maternal care for other (suspected) fetal abnormality and damage, not applicable or unspecified: Secondary | ICD-10-CM | POA: Diagnosis not present

## 2020-12-30 DIAGNOSIS — O99212 Obesity complicating pregnancy, second trimester: Secondary | ICD-10-CM | POA: Diagnosis present

## 2020-12-30 DIAGNOSIS — Z363 Encounter for antenatal screening for malformations: Secondary | ICD-10-CM | POA: Diagnosis not present

## 2020-12-30 DIAGNOSIS — E669 Obesity, unspecified: Secondary | ICD-10-CM

## 2020-12-30 DIAGNOSIS — O283 Abnormal ultrasonic finding on antenatal screening of mother: Secondary | ICD-10-CM

## 2020-12-30 DIAGNOSIS — Z3401 Encounter for supervision of normal first pregnancy, first trimester: Secondary | ICD-10-CM

## 2020-12-31 ENCOUNTER — Ambulatory Visit (INDEPENDENT_AMBULATORY_CARE_PROVIDER_SITE_OTHER): Payer: Medicaid Other | Admitting: Obstetrics and Gynecology

## 2020-12-31 ENCOUNTER — Encounter: Payer: Self-pay | Admitting: Obstetrics and Gynecology

## 2020-12-31 VITALS — BP 124/72 | Ht 62.0 in | Wt 272.8 lb

## 2020-12-31 DIAGNOSIS — Z3402 Encounter for supervision of normal first pregnancy, second trimester: Secondary | ICD-10-CM

## 2020-12-31 DIAGNOSIS — O99012 Anemia complicating pregnancy, second trimester: Secondary | ICD-10-CM

## 2020-12-31 DIAGNOSIS — Z9189 Other specified personal risk factors, not elsewhere classified: Secondary | ICD-10-CM

## 2020-12-31 DIAGNOSIS — Z3A25 25 weeks gestation of pregnancy: Secondary | ICD-10-CM

## 2020-12-31 NOTE — Patient Instructions (Addendum)
Second Trimester of Pregnancy The second trimester of pregnancy is from week 13 through week 27. This is months 4 through 6 of pregnancy. The second trimester is often a time when you feel your best. Your body has adjusted to being pregnant, and you begin to feel better physically. During the second trimester: Morning sickness has lessened or stopped completely. You may have more energy. You may have an increase in appetite. The second trimester is also a time when the unborn baby (fetus) is growing rapidly. At the end of the sixth month, the fetus may be up to 12 inches long and weigh about 1 pounds. You will likely begin to feel the baby move (quickening) between 16 and 20 weeks of pregnancy. Body changes during your second trimester Your body continues to go through many changes during your second trimester. The changes vary and generally return to normal after the baby is born. Physical changes Your weight will continue to increase. You will notice your lower abdomen bulging out. You may begin to get stretch marks on your hips, abdomen, and breasts. Your breasts will continue to grow and to become tender. Dark spots or blotches (chloasma or mask of pregnancy) may develop on your face. A dark line from your belly button to the pubic area (linea nigra) may appear. You may have changes in your hair. These can include thickening of your hair, rapid growth, and changes in texture. Some people also have hair loss during or after pregnancy, or hair that feels dry or thin. Health changes You may develop headaches. You may have heartburn. You may develop constipation. You may develop hemorrhoids or swollen, bulging veins (varicose veins). Your gums may bleed and may be sensitive to brushing and flossing. You may urinate more often because the fetus is pressing on your bladder. You may have back pain. This is caused by: Weight gain. Pregnancy hormones that are relaxing the joints in your  pelvis. A shift in weight and the muscles that support your balance. Follow these instructions at home: Medicines Follow your health care provider's instructions regarding medicine use. Specific medicines may be either safe or unsafe to take during pregnancy. Do not take any medicines unless approved by your health care provider. Take a prenatal vitamin that contains at least 600 micrograms (mcg) of folic acid. Eating and drinking Eat a healthy diet that includes fresh fruits and vegetables, whole grains, good sources of protein such as meat, eggs, or tofu, and low-fat dairy products. Avoid raw meat and unpasteurized juice, milk, and cheese. These carry germs that can harm you and your baby. You may need to take these actions to prevent or treat constipation: Drink enough fluid to keep your urine pale yellow. Eat foods that are high in fiber, such as beans, whole grains, and fresh fruits and vegetables. Limit foods that are high in fat and processed sugars, such as fried or sweet foods. Activity Exercise only as directed by your health care provider. Most people can continue their usual exercise routine during pregnancy. Try to exercise for 30 minutes at least 5 days a week. Stop exercising if you develop contractions in your uterus. Stop exercising if you develop pain or cramping in the lower abdomen or lower back. Avoid exercising if it is very hot or humid or if you are at a high altitude. Avoid heavy lifting. If you choose to, you may have sex unless your health care provider tells you not to. Relieving pain and discomfort Wear a supportive bra  to prevent discomfort from breast tenderness. Take warm sitz baths to soothe any pain or discomfort caused by hemorrhoids. Use hemorrhoid cream if your health care provider approves. Rest with your legs raised (elevated) if you have leg cramps or low back pain. If you develop varicose veins: Wear support hose as told by your health care  provider. Elevate your feet for 15 minutes, 3-4 times a day. Limit salt in your diet. Safety Wear your seat belt at all times when driving or riding in a car. Talk with your health care provider if someone is verbally or physically abusive to you. Lifestyle Do not use hot tubs, steam rooms, or saunas. Do not douche. Do not use tampons or scented sanitary pads. Avoid cat litter boxes and soil used by cats. These carry germs that can cause birth defects in the baby and possibly loss of the fetus by miscarriage or stillbirth. Do not use herbal remedies, alcohol, illegal drugs, or medicines that are not approved by your health care provider. Chemicals in these products can harm your baby. Do not use any products that contain nicotine or tobacco, such as cigarettes, e-cigarettes, and chewing tobacco. If you need help quitting, ask your health care provider. General instructions During a routine prenatal visit, your health care provider will do a physical exam and other tests. He or she will also discuss your overall health. Keep all follow-up visits. This is important. Ask your health care provider for a referral to a local prenatal education class. Ask for help if you have counseling or nutritional needs during pregnancy. Your health care provider can offer advice or refer you to specialists for help with various needs. Where to find more information American Pregnancy Association: americanpregnancy.org [employer]: https://www.todd-brady.net/ Office on [employer] Health: Mighty[employer].nz Contact a health care provider if you have: A headache that does not go away when you take medicine. Vision changes or you see spots in front of your eyes. Mild pelvic cramps, pelvic pressure, or nagging pain in the abdominal area. Persistent nausea, vomiting, or diarrhea. A bad-smelling vaginal discharge or foul-smelling urine. Pain when you  urinate. Sudden or extreme swelling of your face, hands, ankles, feet, or legs. A fever. Get help right away if you: Have fluid leaking from your vagina. Have spotting or bleeding from your vagina. Have severe abdominal cramping or pain. Have difficulty breathing. Have chest pain. Have fainting spells. Have not felt your baby move for the time period told by your health care provider. Have new or increased pain, swelling, or redness in an arm or leg. Summary The second trimester of pregnancy is from week 13 through week 27 (months 4 through 6). Do not use herbal remedies, alcohol, illegal drugs, or medicines that are not approved by your health care provider. Chemicals in these products can harm your baby. Exercise only as directed by your health care provider. Most people can continue their usual exercise routine during pregnancy. Keep all follow-up visits. This is important. This information is not intended to replace advice given to you by your health care provider. Make sure you discuss any questions you have with your health care provider. Document Revised: 07/23/2019 Document Reviewed: 05/29/2019 Elsevier Patient Education  2022 Elsevier Inc. Iron-Rich Diet Iron is a mineral that helps your body produce hemoglobin. Hemoglobin is a protein in red blood cells that carries oxygen to your body's tissues. Eating too little iron may cause you to feel weak and tired, and it can increase your  risk of infection. Iron is naturally found in many foods, and many foods have iron added to them (are iron-fortified). You may need to follow an iron-rich diet if you do not have enough iron in your body due to certain medical conditions. The amount of iron that you need each day depends on your age, your sex, and any medical conditions you have. Follow instructions from your health care provider or a dietitian about how much iron you should eat each day. What are tips for following this plan? Reading  food labels Check food labels to see how many milligrams (mg) of iron are in each serving. Cooking Cook foods in pots and pans that are made from iron. Take these steps to make it easier for your body to absorb iron from certain foods: Soak beans overnight before cooking. Soak whole grains overnight and drain them before using. Ferment flours before baking, such as by using yeast in bread dough. Meal planning When you eat foods that contain iron, you should eat them with foods that are high in vitamin C. These include oranges, peppers, tomatoes, potatoes, and mangoes. Vitamin C helps your body absorb iron. Certain foods and drinks prevent your body from absorbing iron properly. Avoid eating these foods in the same meal as iron-rich foods or with iron supplements. These foods include: Coffee, black tea, and red wine. Milk, dairy products, and foods that are high in calcium. Beans and soybeans. Whole grains. General information Take iron supplements only as told by your health care provider. An overdose of iron can be life-threatening. If you were prescribed iron supplements, take them with orange juice or a vitamin C supplement. When you eat iron-fortified foods or take an iron supplement, you should also eat foods that naturally contain iron, such as meat, poultry, and fish. Eating naturally iron-rich foods helps your body absorb the iron that is added to other foods or contained in a supplement. Iron from animal sources is better absorbed than iron from plant sources. What foods should I eat? Fruits Prunes. Raisins. Eat fruits high in vitamin C, such as oranges, grapefruits, and strawberries, with iron-rich foods. Vegetables Spinach (cooked). Green peas. Broccoli. Fermented vegetables. Eat vegetables high in vitamin C, such as leafy greens, potatoes, bell peppers, and tomatoes, with iron-rich foods. Grains Iron-fortified breakfast cereal. Iron-fortified whole-wheat bread. Enriched rice.  Sprouted grains. Meats and other proteins Beef liver. Beef. [place]. Chicken. Oysters. Shrimp. Tuna. Sardines. Chickpeas. Nuts. Tofu. Pumpkin seeds. Beverages Tomato juice. Fresh orange juice. Prune juice. Hibiscus tea. Iron-fortified instant breakfast shakes. Sweets and desserts Blackstrap molasses. Seasonings and condiments Tahini. Fermented soy sauce. Other foods Wheat germ. The items listed above may not be a complete list of recommended foods and beverages. Contact a dietitian for more information. What foods should I limit? These are foods that should be limited while eating iron-rich foods as they can reduce the absorption of iron in your body. Grains Whole grains. Bran cereal. Bran flour. Meats and other proteins Soybeans. Products made from soy protein. Black beans. Lentils. Mung beans. Split peas. Dairy Milk. Cream. Cheese. Yogurt. Cottage cheese. Beverages Coffee. Black tea. Red wine. Sweets and desserts Cocoa. Chocolate. Ice cream. Seasonings and condiments Basil. Oregano. Large amounts of parsley. The items listed above may not be a complete list of foods and beverages you should limit. Contact a dietitian for more information. Summary Iron is a mineral that helps your body produce hemoglobin. Hemoglobin is a protein in red blood cells that carries oxygen to your  body's tissues. Iron is naturally found in many foods, and many foods have iron added to them (are iron-fortified). When you eat foods that contain iron, you should eat them with foods that are high in vitamin C. Vitamin C helps your body absorb iron. Certain foods and drinks prevent your body from absorbing iron properly, such as whole grains and dairy products. You should avoid eating these foods in the same meal as iron-rich foods or with iron supplements. This information is not intended to replace advice given to you by your health care provider. Make sure you discuss any questions you have with your health  care provider. Document Revised: 01/26/2020 Document Reviewed: 01/26/2020 Elsevier Patient Education  2022 [employer].

## 2020-12-31 NOTE — Progress Notes (Signed)
Routine Prenatal Care Visit  Subjective  Michelle Monroe is a 20 y.o. G1P0 at [redacted]w[redacted]d being seen today for ongoing prenatal care.  She is currently monitored for the following issues for this low-risk pregnancy and has Encounter for supervision of normal first pregnancy in first trimester; OSA (obstructive sleep apnea); Intrauterine pregnancy; Morbid obesity (HCC); and Indication for care in labor and delivery, antepartum on their problem list.  ----------------------------------------------------------------------------------- Patient reports  she is having dizzy episodes in the morning. She reports she went to the hospital to be evaluated for this and was told it was normal in pregnancy .   Contractions: Not present. Vag. Bleeding: None.  Movement: Present. Denies leaking of fluid.  ----------------------------------------------------------------------------------- The following portions of the patient's history were reviewed and updated as appropriate: allergies, current medications, past family history, past medical history, past social history, past surgical history and problem list. Problem list updated.   Objective  Blood pressure 124/72, height 5\' 2"  (1.575 m), weight 272 lb 12.8 oz (123.7 kg). Pregravid weight Pregravid weight not on file Total Weight Gain Not found. Urinalysis:      Fetal Status: Fetal Heart Rate (bpm): 130   Movement: Present     General:  Alert, oriented and cooperative. Patient is in no acute distress.  Skin: Skin is warm and dry. No rash noted.   Cardiovascular: Normal heart rate noted  Respiratory: Normal respiratory effort, no problems with respiration noted  Abdomen: Soft, gravid, appropriate for gestational age. Pain/Pressure: Absent     Pelvic:  Cervical exam deferred        Extremities: Normal range of motion.  Edema: None  Mental Status: Normal mood and affect. Normal behavior. Normal judgment and thought content.     Assessment   20 y.o. G1P0  at [redacted]w[redacted]d by  04/10/2021, by Ultrasound presenting for routine prenatal visit  Plan   pregnancy 1 Problems (from 09/16/20 to present)     Problem Noted Resolved   Encounter for supervision of normal first pregnancy in first trimester 09/16/2020 by 09/18/2020, MD No   Overview Addendum 11/25/2020  2:12 PM by 11/27/2020, MD     Nursing Staff Provider  Office Location  [facility] Dating   7 wk Nadara Mustard  Language  English Anatomy US  MFM nml  Flu Vaccine  Decl Genetic Screen  NIPS: normal xx  TDaP vaccine    Hgb A1C or  GTT Early : prediabetic 1hr: 102 Third trimester :   Covid unvaccinated   LAB RESULTS   Rhogam  Not needed Blood Type A/Positive/-- (07/01 1033)   Feeding Plan Unsure Antibody Negative (07/01 1033)  Contraception PILL Rubella 1.22 (07/01 1033)  Circumcision N/A RPR Non Reactive (07/01 1033)   Pediatrician   HBsAg Negative (07/01 1033)   Support Person  HIV Non Reactive (07/01 1033)  Prenatal Classes  Varicella Non immune    GBS  (For PCN allergy, check sensitivities)   BTL Consent NO    VBAC Consent N/A Pap  2022 NIL    Hgb Electro   Normal   Obesity in pregnancy pregravid BMI: 48  Trichomonas in pregnancy- Treated, TOC neg 8/22          Orthostatics normal Check anemia panel with 28 week labs Will refer for sleep apnea screening  Gestational age appropriate obstetric precautions including but not limited to vaginal bleeding, contractions, leaking of fluid and fetal movement were reviewed in detail with the patient.    Return  in about 3 weeks (around 01/21/2021) for ROB and 1 GTT.  Natale Milch MD [facility], [facility] 12/31/2020, 2:21 PM

## 2021-01-24 ENCOUNTER — Other Ambulatory Visit: Payer: Medicaid Other

## 2021-01-24 ENCOUNTER — Encounter: Payer: Medicaid Other | Admitting: Obstetrics and Gynecology

## 2021-01-25 LAB — 28 WEEK RH+PANEL
Basophils Absolute: 0 10*3/uL (ref 0.0–0.2)
Basos: 0 %
EOS (ABSOLUTE): 0.1 10*3/uL (ref 0.0–0.4)
Eos: 2 %
Gestational Diabetes Screen: 108 mg/dL (ref 70–139)
HIV Screen 4th Generation wRfx: NONREACTIVE
Hematocrit: 29.7 % — ABNORMAL LOW (ref 34.0–46.6)
Hemoglobin: 9.3 g/dL — ABNORMAL LOW (ref 11.1–15.9)
Immature Grans (Abs): 0.1 10*3/uL (ref 0.0–0.1)
Immature Granulocytes: 1 %
Lymphocytes Absolute: 1.6 10*3/uL (ref 0.7–3.1)
Lymphs: 20 %
MCH: 25.1 pg — ABNORMAL LOW (ref 26.6–33.0)
MCHC: 31.3 g/dL — ABNORMAL LOW (ref 31.5–35.7)
MCV: 80 fL (ref 79–97)
Monocytes Absolute: 0.5 10*3/uL (ref 0.1–0.9)
Monocytes: 6 %
Neutrophils Absolute: 5.7 10*3/uL (ref 1.4–7.0)
Neutrophils: 71 %
Platelets: 240 10*3/uL (ref 150–450)
RBC: 3.71 x10E6/uL — ABNORMAL LOW (ref 3.77–5.28)
RDW: 12.5 % (ref 11.7–15.4)
RPR Ser Ql: NONREACTIVE
WBC: 7.9 10*3/uL (ref 3.4–10.8)

## 2021-01-25 LAB — FERRITIN: Ferritin: 14 ng/mL — ABNORMAL LOW (ref 15–150)

## 2021-01-25 LAB — VITAMIN B12: Vitamin B-12: 282 pg/mL (ref 232–1245)

## 2021-01-27 ENCOUNTER — Other Ambulatory Visit: Payer: Self-pay

## 2021-01-27 DIAGNOSIS — O99213 Obesity complicating pregnancy, third trimester: Secondary | ICD-10-CM

## 2021-02-01 ENCOUNTER — Ambulatory Visit: Payer: Medicaid Other | Attending: Obstetrics

## 2021-02-01 ENCOUNTER — Other Ambulatory Visit: Payer: Self-pay

## 2021-02-01 DIAGNOSIS — Z3A30 30 weeks gestation of pregnancy: Secondary | ICD-10-CM

## 2021-02-01 DIAGNOSIS — O358XX0 Maternal care for other (suspected) fetal abnormality and damage, not applicable or unspecified: Secondary | ICD-10-CM

## 2021-02-01 DIAGNOSIS — O99213 Obesity complicating pregnancy, third trimester: Secondary | ICD-10-CM | POA: Diagnosis not present

## 2021-02-01 DIAGNOSIS — E669 Obesity, unspecified: Secondary | ICD-10-CM | POA: Diagnosis not present

## 2021-02-24 ENCOUNTER — Other Ambulatory Visit: Payer: Self-pay

## 2021-02-24 DIAGNOSIS — O99213 Obesity complicating pregnancy, third trimester: Secondary | ICD-10-CM

## 2021-03-01 ENCOUNTER — Ambulatory Visit: Payer: Medicaid Other | Attending: Obstetrics and Gynecology

## 2021-03-01 ENCOUNTER — Encounter: Payer: Self-pay | Admitting: Licensed Practical Nurse

## 2021-03-01 ENCOUNTER — Ambulatory Visit (INDEPENDENT_AMBULATORY_CARE_PROVIDER_SITE_OTHER): Payer: Medicaid Other | Admitting: Licensed Practical Nurse

## 2021-03-01 ENCOUNTER — Other Ambulatory Visit: Payer: Self-pay

## 2021-03-01 VITALS — BP 118/78 | Wt 265.0 lb

## 2021-03-01 DIAGNOSIS — O99213 Obesity complicating pregnancy, third trimester: Secondary | ICD-10-CM | POA: Insufficient documentation

## 2021-03-01 DIAGNOSIS — Z369 Encounter for antenatal screening, unspecified: Secondary | ICD-10-CM | POA: Diagnosis not present

## 2021-03-01 DIAGNOSIS — Z3A34 34 weeks gestation of pregnancy: Secondary | ICD-10-CM | POA: Diagnosis not present

## 2021-03-01 DIAGNOSIS — O358XX0 Maternal care for other (suspected) fetal abnormality and damage, not applicable or unspecified: Secondary | ICD-10-CM | POA: Insufficient documentation

## 2021-03-01 DIAGNOSIS — O0990 Supervision of high risk pregnancy, unspecified, unspecified trimester: Secondary | ICD-10-CM

## 2021-03-01 DIAGNOSIS — E669 Obesity, unspecified: Secondary | ICD-10-CM | POA: Diagnosis not present

## 2021-03-01 DIAGNOSIS — Z3685 Encounter for antenatal screening for Streptococcus B: Secondary | ICD-10-CM

## 2021-03-01 NOTE — Progress Notes (Signed)
Routine Prenatal Care Visit  Subjective  Michelle Monroe is a 21 y.o. G1P0 at [redacted]w[redacted]d being seen today for ongoing prenatal care.  She is currently monitored for the following issues for this high-risk pregnancy and has Encounter for supervision of normal first pregnancy in first trimester; OSA (obstructive sleep apnea); Intrauterine pregnancy; Morbid obesity ([place]); and Indication for care in labor and delivery, antepartum on their problem list.  ----------------------------------------------------------------------------------- Patient reports Was seen by MFM today for BMI >40, normal growth and AFI, BPP 8/8 today. Doing ok, seemed a little down today, admits she is nervous about postpartum.  Reports that she currently lives with her aunt, they share a bed.  She is not sure where they will put the infant.  She does have a bassinet and infant supplies.  Her partner lived in [place], they are currently trying to find housing together, he should be able to come for the birth and help out-but her Aunt's place is small.  Her mother lives 3 hours away, his family has offered to help.  Encouraged her to reach out to her mother and have a frank conversation with her partner to plan their PP care.  Encouraged her to accept his family's help. (She did not want his family doing all of the help and hers not participating, reports she does like his family) Contractions: Not present. Vag. Bleeding: None.  Movement: Present. Leaking Fluid denies.  ----------------------------------------------------------------------------------- The following portions of the patient's history were reviewed and updated as appropriate: allergies, current medications, past family history, past medical history, past social history, past surgical history and problem list. Problem list updated.  Objective  Blood pressure 118/78, weight 265 lb (120.2 kg). Pregravid weight Pregravid weight not on file Total Weight Gain Not  found. Urinalysis: Urine Protein    Urine Glucose    Fetal Status: Fetal Heart Rate (bpm): 140   Movement: Present  Presentation: Vertex  General:  Alert, oriented and cooperative. Patient is in no acute distress.  Skin: Skin is warm and dry. No rash noted.   Cardiovascular: Normal heart rate noted  Respiratory: Normal respiratory effort, no problems with respiration noted  Abdomen: Soft, gravid, appropriate for gestational age. Pain/Pressure: Present     Pelvic:  Cervical exam deferred        Extremities: Normal range of motion.  Edema: None  Mental Status: Normal mood and affect. Normal behavior. Normal judgment and thought content.   Assessment   20 y.o. G1P0 at [redacted]w[redacted]d by  04/10/2021, by Ultrasound presenting for routine prenatal visit  Plan   pregnancy 1 Problems (from 09/16/20 to present)     Problem Noted Resolved   Encounter for supervision of normal first pregnancy in first trimester 09/16/2020 by Homero Fellers, MD No   Overview Addendum 11/25/2020  2:12 PM by Gae Dry, MD     Nursing Staff Provider  Office Location  [facility] Dating   7 wk [place]  Language  English Anatomy US  MFM nml  Flu Vaccine  Decl Genetic Screen  NIPS: normal xx  TDaP vaccine    Hgb A1C or  GTT Early : prediabetic 1hr: 102 Third trimester :   Covid unvaccinated   LAB RESULTS   Rhogam  Not needed Blood Type A/Positive/-- (07/01 1033)   Feeding Plan Unsure Antibody Negative (07/01 1033)  Contraception PILL Rubella 1.22 (07/01 1033)  Circumcision N/A RPR Non Reactive (07/01 1033)   Pediatrician   HBsAg Negative (07/01 1033)   Support Person  HIV Non Reactive (07/01 1033)  Prenatal Classes  Varicella Non immune    GBS  (For PCN allergy, check sensitivities)   BTL Consent NO    VBAC Consent N/A Pap  2022 NIL    Hgb Electro   Normal   Obesity in pregnancy pregravid BMI: 48  Trichomonas in pregnancy- Treated, TOC neg 8/22           Preterm labor symptoms and general obstetric  precautions including but not limited to vaginal bleeding, contractions, leaking of fluid and fetal movement were reviewed in detail with the patient. Please refer to After Visit Summary for other counseling recommendations.   36 weeks labs next visit   Has regularly scheduled apts with MFM   Return in about 2 weeks (around 03/15/2021) for ROB.  Message to Angie to offer resources to pt   Roberto Scales, [place], [facility]  03/01/21  4:42 PM

## 2021-03-01 NOTE — Progress Notes (Signed)
No  Vb. No lof.

## 2021-03-07 ENCOUNTER — Other Ambulatory Visit: Payer: Self-pay

## 2021-03-07 DIAGNOSIS — O283 Abnormal ultrasonic finding on antenatal screening of mother: Secondary | ICD-10-CM

## 2021-03-07 DIAGNOSIS — O99213 Obesity complicating pregnancy, third trimester: Secondary | ICD-10-CM

## 2021-03-08 ENCOUNTER — Ambulatory Visit: Payer: Medicaid Other | Attending: Obstetrics

## 2021-03-08 ENCOUNTER — Other Ambulatory Visit: Payer: Self-pay

## 2021-03-08 VITALS — BP 115/91 | HR 99 | Temp 97.9°F | Ht 62.0 in | Wt 269.5 lb

## 2021-03-08 DIAGNOSIS — Z369 Encounter for antenatal screening, unspecified: Secondary | ICD-10-CM | POA: Diagnosis not present

## 2021-03-08 DIAGNOSIS — Z3401 Encounter for supervision of normal first pregnancy, first trimester: Secondary | ICD-10-CM

## 2021-03-08 DIAGNOSIS — O283 Abnormal ultrasonic finding on antenatal screening of mother: Secondary | ICD-10-CM | POA: Insufficient documentation

## 2021-03-08 DIAGNOSIS — O99213 Obesity complicating pregnancy, third trimester: Secondary | ICD-10-CM

## 2021-03-08 DIAGNOSIS — O358XX0 Maternal care for other (suspected) fetal abnormality and damage, not applicable or unspecified: Secondary | ICD-10-CM | POA: Insufficient documentation

## 2021-03-08 DIAGNOSIS — Z3A35 35 weeks gestation of pregnancy: Secondary | ICD-10-CM | POA: Diagnosis not present

## 2021-03-08 DIAGNOSIS — E669 Obesity, unspecified: Secondary | ICD-10-CM | POA: Insufficient documentation

## 2021-03-10 ENCOUNTER — Other Ambulatory Visit: Payer: Self-pay

## 2021-03-10 DIAGNOSIS — O283 Abnormal ultrasonic finding on antenatal screening of mother: Secondary | ICD-10-CM

## 2021-03-10 DIAGNOSIS — O99213 Obesity complicating pregnancy, third trimester: Secondary | ICD-10-CM

## 2021-03-15 ENCOUNTER — Other Ambulatory Visit: Payer: Medicaid Other

## 2021-03-15 ENCOUNTER — Encounter: Payer: Medicaid Other | Admitting: Obstetrics

## 2021-03-15 ENCOUNTER — Telehealth: Payer: Self-pay

## 2021-03-16 ENCOUNTER — Other Ambulatory Visit (HOSPITAL_COMMUNITY)
Admission: RE | Admit: 2021-03-16 | Discharge: 2021-03-16 | Disposition: A | Discharge status: Home / Self Care | Payer: Medicaid Other | Source: Ambulatory Visit | Attending: Obstetrics | Admitting: Obstetrics

## 2021-03-16 ENCOUNTER — Ambulatory Visit (INDEPENDENT_AMBULATORY_CARE_PROVIDER_SITE_OTHER): Payer: Medicaid Other | Admitting: Advanced Practice Midwife

## 2021-03-16 ENCOUNTER — Encounter: Payer: Self-pay | Admitting: Advanced Practice Midwife

## 2021-03-16 ENCOUNTER — Other Ambulatory Visit: Payer: Self-pay

## 2021-03-16 VITALS — BP 118/72 | Wt 274.6 lb

## 2021-03-16 DIAGNOSIS — Z3403 Encounter for supervision of normal first pregnancy, third trimester: Secondary | ICD-10-CM | POA: Insufficient documentation

## 2021-03-16 DIAGNOSIS — Z113 Encounter for screening for infections with a predominantly sexual mode of transmission: Secondary | ICD-10-CM

## 2021-03-16 DIAGNOSIS — Z3A36 36 weeks gestation of pregnancy: Secondary | ICD-10-CM | POA: Diagnosis present

## 2021-03-16 DIAGNOSIS — Z369 Encounter for antenatal screening, unspecified: Secondary | ICD-10-CM

## 2021-03-16 DIAGNOSIS — Z3685 Encounter for antenatal screening for Streptococcus B: Secondary | ICD-10-CM

## 2021-03-16 LAB — POCT URINALYSIS DIPSTICK OB
Glucose, UA: NEGATIVE
POC,PROTEIN,UA: NEGATIVE

## 2021-03-16 NOTE — Patient Instructions (Signed)
Pain Relief During Labor and Delivery Many things can cause pain during labor and delivery, including: Pressure due to the baby moving through the pelvis. Stretching of tissues due to the baby moving through the birth canal. Muscle tension due to anxiety or nervousness. The uterus tightening (contracting)and relaxing to help move the baby. How do I get pain relief during labor and delivery? Discuss your pain relief options with your health care provider during your prenatal visits. Explore the options offered by your hospital or birth center. There are many ways to deal with the pain of labor and delivery. You can try relaxation techniques or doing relaxing activities, taking a warm shower or bath (hydrotherapy), or other methods. There are also many medicines available to help control pain. Relaxation techniques and activities Practice relaxation techniques or do relaxing activities, such as: Focused breathing. Meditation. Visualization. Aroma therapy. Listening to your favorite music. Hypnosis. Hydrotherapy Take a warm shower or bath. This may: Provide comfort and relaxation. Lessen your feeling of pain. Reduce the amount of pain medicine needed. Shorten the length of labor. Other methods Try doing other things, such as: Getting a massage or having counterpressure on your back. Applying warm packs or ice packs. Changing positions often, moving around, or using a birthing ball. Medicines You may be given: Pain medicine through an IV or an injection into a muscle. Pain medicine inserted into your spinal column. Injections of sterile water just under the skin on your lower back. Nitrous oxide inhalation therapy, also called laughing gas. What kinds of medicine are available for pain relief? There are two kinds of medicines that can be used to relieve pain during labor and delivery: Analgesics. These medicines decrease pain without causing you to lose feeling or the ability to move  your muscles. Anesthetics. These medicines block feeling in the body and can decrease your ability to move freely. Both kinds of medicine can cause minor side effects, such as nausea, trouble concentrating, and sleepiness. They can also affect the baby's heart rate before birth and his or her breathing after birth. For this reason, health care providers are careful about when and how much medicine is given. Which medicines are used to provide pain relief? Common medicines The most common medicines used to help manage pain during labor and delivery include: Opioids. Opioids are medicines that decrease how much pain you feel (perception of pain). These medicines can be given through an IV or may be used with anesthetics to block pain. Epidural analgesia. Epidural analgesia is given through a very thin tube that is inserted into the lower back. Medicine is delivered continuously to the area near your spinal column nerves (epidural space). After having this treatment, you may be able to move your legs, but you will not be able to walk. Depending on the amount and type of medicine given, you may lose all feeling in the lower half of your body, or you may have some sensation, including the urge to push. This treatment can be used to give pain relief for a vaginal birth. Sometimes, a numbing medicine is injected into the spinal fluid when an epidural catheter is placed. This provides for immediate relief but only lasts for 1-2 hours. Once it wears off, the epidural will provide pain relief. This is called a combined spinal-epidural (CSE) block. Intrathecal analgesia (spinal analgesia). Intrathecal analgesia is similar to epidural analgesia, but the medicine is injected into the spinal fluid instead of the epidural space. It is usually only given once. It  starts to relieve pain quickly, but the pain relief lasts only 1-2 hours. Pudendal block. This block is done by injecting numbing medicine through the wall of  the vagina and into a nerve in the pelvis. Other medicines Other medicines used to help manage pain during labor and delivery include: Local anesthetics. These are used to numb a small area of the body. They may be used along with another kind of medicine or used to numb the nerves of the vagina, cervix, and perineum during the second stage of labor. Spinal block (spinal anesthesia). Spinal anesthesia is similar to spinal analgesia, but the medicine that is used contains longer-acting numbing medicines and pain medicines. This type of anesthesia can be used for a cesarean delivery and allows you to stay awake for the birth of your baby. General anesthetics cause you to lose consciousness so you do not feel pain. They are usually only used for an emergency cesarean delivery. These medicines are given through an IV or a mask or both. These medicines are used as part of a procedure or for an emergency delivery. Summary Women have many options to help them manage the pain associated with labor and delivery. You can try doing relaxing activities, taking a warm shower or bath, or other methods. There are also many medicines available to help control pain during labor and delivery. Talk with your health care provider about what options are available to you. This information is not intended to replace advice given to you by your health care provider. Make sure you discuss any questions you have with your health care provider. Document Revised: 01/01/2019 Document Reviewed: 01/01/2019 Elsevier Patient Education  2022 [employer] Vaginal Delivery Vaginal delivery means that you give birth by pushing your baby out of your birth canal (vagina). Your health care team will help you before, during, and after vaginal delivery. Birth experiences are unique for every woman and every pregnancy, and birth experiences vary depending on where you choose to give birth. What are the risks and benefits? Generally, this  is safe. However, problems may occur, including: Bleeding. Infection. Damage to other structures such as vaginal tearing. Allergic reactions to medicines. Despite the risks, benefits of vaginal delivery include less risk of bleeding and infection and a shorter recovery time compared to a Cesarean delivery. Cesarean delivery, or C-section, is the surgical delivery of a baby. What happens when I arrive at the birth center or hospital? Once you are in labor and have been admitted into the hospital or birth center, your health care team may: Review your pregnancy history and any concerns that you have. Talk with you about your birth plan and discuss pain control options. Check your blood pressure, breathing, and heartbeat. Assess your baby's heartbeat. Monitor your uterus for contractions. Check whether your bag of water (amniotic sac) has broken (ruptured). Insert an IV into one of your veins. This may be used to give you fluids and medicines. Monitoring Your health care team may assess your contractions (uterine monitoring) and your baby's heart rate (fetal monitoring). You may need to be monitored: Often, but not continuously (intermittently). All the time or for long periods at a time (continuously). Continuous monitoring may be needed if: You are taking certain medicines, such as medicine to relieve pain or make your contractions stronger. You have pregnancy or labor complications. Monitoring may be done by: Placing a special stethoscope or a handheld monitoring device on your abdomen to check your baby's heartbeat and to check for contractions.  Placing monitors on your abdomen (external monitors) to record your baby's heartbeat and the frequency and length of contractions. °Placing monitors inside your uterus through your vagina (internal monitors) to record your baby's heartbeat and the frequency, length, and strength of your contractions. Depending on the type of monitor, it may remain in  your uterus or on your baby's head until birth. °Telemetry. This is a type of continuous monitoring that can be done with external or internal monitors. Instead of having to stay in bed, you are able to move around. °Physical exam °Your health care team may perform frequent physical exams. This may include: °Checking how and where your baby is positioned in your uterus. °Checking your cervix to determine: °Whether it is thinning out (effacing). °Whether it is opening up (dilating). °What happens during labor and delivery? °Normal labor and delivery is divided into the following three stages: °Stage 1 °This is the longest stage of labor. °Throughout this stage, you will feel contractions. Contractions generally feel mild, infrequent, and irregular at first. They get stronger, more frequent, and more regular as you move through this stage. You may have contractions about every 2-3 minutes. °This stage ends when your cervix is completely dilated to 4 inches (10 cm) and completely effaced. °Stage 2 °This stage starts once your cervix is completely effaced and dilated and lasts until the delivery of your baby. °This is the stage where you will feel an urge to push your baby out of your vagina. °You may feel stretching and burning pain, especially when the widest part of your baby's head passes through the vaginal opening (crowning). °Once your baby is delivered, the umbilical cord will be clamped and cut. Timing of cutting the cord will depend on your wishes, your baby's health, and your health care provider's practices. °Your baby will be placed on your bare chest (skin-to-skin contact) in an upright position and covered with a warm blanket. If you are choosing to breastfeed, watch your baby for feeding cues, like rooting or sucking, and help the baby to your breast for his or her first feeding. °Stage 3 °This stage starts immediately after the birth of your baby and ends after you deliver the placenta. °This stage may  take anywhere from 5 to 30 minutes. °After your baby has been delivered, you will feel contractions as your body expels the placenta. These contractions also help your uterus get smaller and reduce bleeding. °What can I expect after labor and delivery? °After labor is over, you and your baby will be assessed closely until you are ready to go home. Your health care team will teach you how to care for yourself and your baby. °You and your baby may be encouraged to stay in the same room (rooming in) during your hospital stay. This will help promote early bonding and successful breastfeeding. °Your uterus will be checked and massaged regularly (fundal massage). °You may continue to receive fluids and medicines through an IV. °You will have some soreness and pain in your abdomen, vagina, and the area of skin between your vaginal opening and your anus (perineum). °If an incision was made near your vagina (episiotomy) or if you had some vaginal tearing during delivery, cold compresses may be placed on your episiotomy or your tear. This helps to reduce pain and swelling. °It is normal to have vaginal bleeding after delivery. Wear a sanitary pad for vaginal bleeding and discharge. °Summary °Vaginal delivery means that you will give birth by pushing your baby out of   your birth canal (vagina). Your health care team will monitor you and your baby throughout the stages of labor. After you deliver your baby, your health care team will continue to assess you and your baby to ensure you are both recovering as expected after delivery. This information is not intended to replace advice given to you by your health care provider. Make sure you discuss any questions you have with your health care provider. Document Revised: 01/12/2020 Document Reviewed: 01/12/2020 Elsevier Patient Education  2022 [employer] Signs and Symptoms of Labor Labor is the body's natural process of moving the baby and the placenta out of the uterus.  The process of labor usually starts when the baby is full-term, between 33 and 41 weeks of pregnancy. Signs and symptoms that you are close to going into labor As your body prepares for labor and the birth of your baby, you may notice the following symptoms in the weeks and days before true labor starts: Passing a small amount of thick, bloody mucus from your vagina. This is called normal bloody show or losing your mucus plug. This may happen more than a week before labor begins, or right before labor begins, as the opening of the cervix starts to widen (dilate). For some women, the entire mucus plug passes at once. For others, pieces of the mucus plug may gradually pass over several days. Your baby moving (dropping) lower in your pelvis to get into position for birth (lightening). When this happens, you may feel more pressure on your bladder and pelvic bone and less pressure on your ribs. This may make it easier to breathe. It may also cause you to need to urinate more often and have problems with bowel movements. Having "practice contractions," also called Braxton Hicks contractions or false labor. These occur at irregular (unevenly spaced) intervals that are more than 10 minutes apart. False labor contractions are common after exercise or sexual activity. They will stop if you change position, rest, or drink fluids. These contractions are usually mild and do not get stronger over time. They may feel like: A backache or back pain. Mild cramps, similar to menstrual cramps. Tightening or pressure in your abdomen. Other early symptoms include: Nausea or loss of appetite. Diarrhea. Having a sudden burst of energy, or feeling very tired. Mood changes. Having trouble sleeping. Signs and symptoms that labor has begun Signs that you are in labor may include: Having contractions that come at regular (evenly spaced) intervals and increase in intensity. This may feel like more intense tightening or pressure  in your abdomen that moves to your back. Contractions may also feel like rhythmic pain in your upper thighs or back that comes and goes at regular intervals. If you are delivering for the first time, this change in intensity of contractions often occurs at a more gradual pace. If you have given birth before, you may notice a more rapid progression of contraction changes. Feeling pressure in the vaginal area. Your water breaking (rupture of membranes). This is when the sac of fluid that surrounds your baby breaks. Fluid leaking from your vagina may be clear or blood-tinged. Labor usually starts within 24 hours of your water breaking, but it may take longer to begin. Some people may feel a sudden gush of fluid; others may notice repeatedly damp underwear. Follow these instructions at home:  When labor starts, or if your water breaks, call your health care provider or nurse care line. Based on your situation, they will determine when  you should go in for an exam. During early labor, you may be able to rest and manage symptoms at home. Some strategies to try at home include: Breathing and relaxation techniques. Taking a warm bath or shower. Listening to music. Using a heating pad on the lower back for pain. If directed, apply heat to the area as often as told by your health care provider. Use the heat source that your health care provider recommends, such as a moist heat pack or a heating pad. Place a towel between your skin and the heat source. Leave the heat on for 20-30 minutes. Remove the heat if your skin turns bright red. This is especially important if you are unable to feel pain, heat, or cold. You have a greater risk of getting burned. Contact a health care provider if: Your labor has started. Your water breaks. You have nausea, vomiting, or diarrhea. Get help right away if: You have painful, regular contractions that are 5 minutes apart or less. Labor starts before you are [redacted] weeks  along in your pregnancy. You have a fever. You have bright red blood coming from your vagina. You do not feel your baby moving. You have a severe headache with or without vision problems. You have chest pain or shortness of breath. These symptoms may represent a serious problem that is an emergency. Do not wait to see if the symptoms will go away. Get medical help right away. Call your local emergency services (911 in the [place] Do not drive yourself to the hospital. Summary Labor is your body's natural process of moving your baby and the placenta out of your uterus. The process of labor usually starts when your baby is full-term, between 78 and 40 weeks of pregnancy. When labor starts, or if your water breaks, call your health care provider or nurse care line. Based on your situation, they will determine when you should go in for an exam. This information is not intended to replace advice given to you by your health care provider. Make sure you discuss any questions you have with your health care provider. Document Revised: 06/29/2020 Document Reviewed: 06/29/2020 Elsevier Patient Education  2022 [employer].

## 2021-03-16 NOTE — Addendum Note (Signed)
Addended by: Clement Husbands A on: 03/16/2021 04:34 PM   Modules accepted: Orders

## 2021-03-16 NOTE — Progress Notes (Signed)
Routine Prenatal Care Visit  Subjective  Michelle Monroe is a 21 y.o. G1P0 at [redacted]w[redacted]d being seen today for ongoing prenatal care.  She is currently monitored for the following issues for this low-risk pregnancy and has Supervision of normal pregnancy; OSA (obstructive sleep apnea); Intrauterine pregnancy; Morbid obesity ([place]); and Indication for care in labor and delivery, antepartum on their problem list.  ----------------------------------------------------------------------------------- Patient reports no complaints.  She was unable to make it to her growth scan visit yesterday due to car trouble. She rescheduled for next week.  Contractions: Not present. Vag. Bleeding: None.  Movement: Present. Leaking Fluid denies.  ----------------------------------------------------------------------------------- The following portions of the patient's history were reviewed and updated as appropriate: allergies, current medications, past family history, past medical history, past social history, past surgical history and problem list. Problem list updated.  Objective  Blood pressure 118/72, weight 274 lb 9.6 oz (124.6 kg). Pregravid weight 250 lb (113.4 kg) Total Weight Gain 24 lb 9.6 oz (11.2 kg) Urinalysis: Urine Protein    Urine Glucose    Fetal Status: Fetal Heart Rate (bpm): 137 Fundal Height: 38 cm Movement: Present  Presentation: Vertex  General:  Alert, oriented and cooperative. Patient is in no acute distress.  Skin: Skin is warm and dry. No rash noted.   Cardiovascular: Normal heart rate noted  Respiratory: Normal respiratory effort, no problems with respiration noted  Abdomen: Soft, gravid, appropriate for gestational age. Pain/Pressure: Absent     Pelvic:  Cervical exam performed Dilation: Closed Effacement (%): Thick Station: -3  Extremities: Normal range of motion.  Edema: None  Mental Status: Normal mood and affect. Normal behavior. Normal judgment and thought content.   Assessment   21  y.o. G1P0 at [redacted]w[redacted]d by  04/10/2021, by Ultrasound presenting for routine prenatal visit  Plan   pregnancy 1 Problems (from 09/16/20 to present)    Problem Noted Resolved   Supervision of normal pregnancy 09/16/2020 by Homero Fellers, MD No   Overview Addendum 11/25/2020  2:12 PM by Gae Dry, MD     Nursing Staff Provider  Office Location  [facility] Dating   7 wk [place]  Language  English Anatomy US  MFM nml  Flu Vaccine  Decl Genetic Screen  NIPS: normal xx  TDaP vaccine    Hgb A1C or  GTT Early : prediabetic 1hr: 102 Third trimester :   Covid unvaccinated   LAB RESULTS   Rhogam  Not needed Blood Type A/Positive/-- (07/01 1033)   Feeding Plan Unsure Antibody Negative (07/01 1033)  Contraception PILL Rubella 1.22 (07/01 1033)  Circumcision N/A RPR Non Reactive (07/01 1033)   Pediatrician   HBsAg Negative (07/01 1033)   Support Person  HIV Non Reactive (07/01 1033)  Prenatal Classes  Varicella Non immune    GBS  (For PCN allergy, check sensitivities)   BTL Consent NO    VBAC Consent N/A Pap  2022 NIL    Hgb Electro   Normal   Obesity in pregnancy pregravid BMI: 48  Trichomonas in pregnancy- Treated, TOC neg 8/22          Preterm labor symptoms and general obstetric precautions including but not limited to vaginal bleeding, contractions, leaking of fluid and fetal movement were reviewed in detail with the patient. Please refer to After Visit Summary for other counseling recommendations.   Return in about 1 week (around 03/23/2021) for rob.  Rod Can, CNM 03/16/2021 4:23 PM

## 2021-03-17 ENCOUNTER — Other Ambulatory Visit: Payer: Self-pay

## 2021-03-17 DIAGNOSIS — O283 Abnormal ultrasonic finding on antenatal screening of mother: Secondary | ICD-10-CM

## 2021-03-18 LAB — CERVICOVAGINAL ANCILLARY ONLY
Chlamydia: NEGATIVE
Comment: NEGATIVE
Comment: NEGATIVE
Comment: NORMAL
Neisseria Gonorrhea: NEGATIVE
Trichomonas: NEGATIVE

## 2021-03-18 LAB — STREP GP B NAA: Strep Gp B NAA: NEGATIVE

## 2021-03-22 ENCOUNTER — Ambulatory Visit (INDEPENDENT_AMBULATORY_CARE_PROVIDER_SITE_OTHER): Payer: Medicaid Other | Admitting: Advanced Practice Midwife

## 2021-03-22 ENCOUNTER — Encounter: Payer: Self-pay | Admitting: Advanced Practice Midwife

## 2021-03-22 ENCOUNTER — Other Ambulatory Visit: Payer: Self-pay

## 2021-03-22 ENCOUNTER — Ambulatory Visit: Payer: Medicaid Other | Attending: Maternal & Fetal Medicine

## 2021-03-22 VITALS — BP 120/80 | Wt 272.0 lb

## 2021-03-22 VITALS — BP 105/81 | HR 91 | Temp 98.0°F | Ht 62.0 in | Wt 270.0 lb

## 2021-03-22 DIAGNOSIS — O358XX0 Maternal care for other (suspected) fetal abnormality and damage, not applicable or unspecified: Secondary | ICD-10-CM | POA: Diagnosis not present

## 2021-03-22 DIAGNOSIS — Z3403 Encounter for supervision of normal first pregnancy, third trimester: Secondary | ICD-10-CM

## 2021-03-22 DIAGNOSIS — O99213 Obesity complicating pregnancy, third trimester: Secondary | ICD-10-CM | POA: Diagnosis present

## 2021-03-22 DIAGNOSIS — O0993 Supervision of high risk pregnancy, unspecified, third trimester: Secondary | ICD-10-CM

## 2021-03-22 DIAGNOSIS — Z3A37 37 weeks gestation of pregnancy: Secondary | ICD-10-CM | POA: Insufficient documentation

## 2021-03-22 DIAGNOSIS — O283 Abnormal ultrasonic finding on antenatal screening of mother: Secondary | ICD-10-CM | POA: Diagnosis not present

## 2021-03-22 DIAGNOSIS — O99210 Obesity complicating pregnancy, unspecified trimester: Secondary | ICD-10-CM | POA: Insufficient documentation

## 2021-03-22 NOTE — Progress Notes (Signed)
Routine Prenatal Care Visit  Subjective  Michelle Monroe is a 21 y.o. G1P0 at [redacted]w[redacted]d being seen today for ongoing prenatal care.  She is currently monitored for the following issues for this high-risk pregnancy and has Supervision of high risk pregnancy; OSA (obstructive sleep apnea); Intrauterine pregnancy; Morbid obesity ([facility]); obesity affecting pregnancy; and Indication for care in labor and delivery, antepartum on their problem list.  ----------------------------------------------------------------------------------- Patient reports no complaints.  She was seen earlier today by MFM [facility] for AFI/BPP. All was normal. 39-40 week IOL recommended. Contractions: Not present. Vag. Bleeding: None.  Movement: Present. Leaking Fluid denies.  ----------------------------------------------------------------------------------- The following portions of the patient's history were reviewed and updated as appropriate: allergies, current medications, past family history, past medical history, past social history, past surgical history and problem list. Problem list updated.  Objective  Blood pressure 120/80, weight 272 lb (123.4 kg). Pregravid weight 250 lb (113.4 kg) Total Weight Gain 22 lb (9.979 kg) Urinalysis: Urine Protein    Urine Glucose    Fetal Status: Fetal Heart Rate (bpm): 130   Movement: Present  Presentation: Vertex  General:  Alert, oriented and cooperative. Patient is in no acute distress.  Skin: Skin is warm and dry. No rash noted.   Cardiovascular: Normal heart rate noted  Respiratory: Normal respiratory effort, no problems with respiration noted  Abdomen: Soft, gravid, appropriate for gestational age. Pain/Pressure: Present     Pelvic:  Cervical exam deferred        Extremities: Normal range of motion.  Edema: None  Mental Status: Normal mood and affect. Normal behavior. Normal judgment and thought content.   Assessment   21 y.o. G1P0 at [redacted]w[redacted]d by  04/10/2021, by Ultrasound presenting  for routine prenatal visit  Plan   pregnancy 1 Problems (from 09/16/20 to present)    Problem Noted Resolved   Supervision of normal pregnancy 09/16/2020 by Natale Milch, MD No   Overview Addendum 11/25/2020  2:12 PM by Nadara Mustard, MD     Nursing Staff Provider  Office Location  [facility] Dating   7 wk [place]  Language  English Anatomy US  MFM nml  Flu Vaccine  Decl Genetic Screen  NIPS: normal xx  TDaP vaccine    Hgb A1C or  GTT Early : prediabetic 1hr: 102 Third trimester :   Covid unvaccinated   LAB RESULTS   Rhogam  Not needed Blood Type A/Positive/-- (07/01 1033)   Feeding Plan Unsure Antibody Negative (07/01 1033)  Contraception PILL Rubella 1.22 (07/01 1033)  Circumcision N/A RPR Non Reactive (07/01 1033)   Pediatrician   HBsAg Negative (07/01 1033)   Support Person  HIV Non Reactive (07/01 1033)  Prenatal Classes  Varicella Non immune    GBS  (For PCN allergy, check sensitivities)   BTL Consent NO    VBAC Consent N/A Pap  2022 NIL    Hgb Electro   Normal   Obesity in pregnancy pregravid BMI: 48  Trichomonas in pregnancy- Treated, TOC neg 8/22          Term labor symptoms and general obstetric precautions including but not limited to vaginal bleeding, contractions, leaking of fluid and fetal movement were reviewed in detail with the patient.   Return in about 1 week (around 03/29/2021) for rob.  Tresea Mall, CNM 03/22/2021 1:32 PM

## 2021-03-29 ENCOUNTER — Other Ambulatory Visit: Payer: Self-pay

## 2021-03-29 ENCOUNTER — Ambulatory Visit: Payer: Medicaid Other | Attending: Maternal & Fetal Medicine

## 2021-03-29 ENCOUNTER — Other Ambulatory Visit: Payer: Self-pay | Admitting: *Deleted

## 2021-03-29 ENCOUNTER — Encounter: Payer: Self-pay | Admitting: Licensed Practical Nurse

## 2021-03-29 ENCOUNTER — Ambulatory Visit (INDEPENDENT_AMBULATORY_CARE_PROVIDER_SITE_OTHER): Payer: Medicaid Other | Admitting: Licensed Practical Nurse

## 2021-03-29 ENCOUNTER — Encounter: Payer: Self-pay | Admitting: *Deleted

## 2021-03-29 ENCOUNTER — Ambulatory Visit: Payer: Medicaid Other | Admitting: *Deleted

## 2021-03-29 VITALS — BP 120/80 | Wt 274.0 lb

## 2021-03-29 VITALS — BP 112/74 | HR 110

## 2021-03-29 DIAGNOSIS — O358XX0 Maternal care for other (suspected) fetal abnormality and damage, not applicable or unspecified: Secondary | ICD-10-CM | POA: Insufficient documentation

## 2021-03-29 DIAGNOSIS — E669 Obesity, unspecified: Secondary | ICD-10-CM | POA: Diagnosis not present

## 2021-03-29 DIAGNOSIS — O99213 Obesity complicating pregnancy, third trimester: Secondary | ICD-10-CM

## 2021-03-29 DIAGNOSIS — O0990 Supervision of high risk pregnancy, unspecified, unspecified trimester: Secondary | ICD-10-CM

## 2021-03-29 DIAGNOSIS — Z3A38 38 weeks gestation of pregnancy: Secondary | ICD-10-CM

## 2021-03-29 DIAGNOSIS — Z3403 Encounter for supervision of normal first pregnancy, third trimester: Secondary | ICD-10-CM

## 2021-03-29 DIAGNOSIS — O0993 Supervision of high risk pregnancy, unspecified, third trimester: Secondary | ICD-10-CM

## 2021-03-29 DIAGNOSIS — O283 Abnormal ultrasonic finding on antenatal screening of mother: Secondary | ICD-10-CM

## 2021-03-29 NOTE — Progress Notes (Signed)
Routine Prenatal Care Visit  Subjective  Michelle Monroe is a 21 y.o. G1P0 at [redacted]w[redacted]d being seen today for ongoing prenatal care.  She is currently monitored for the following issues for this high-risk pregnancy and has Supervision of high-risk pregnancy; OSA (obstructive sleep apnea); Intrauterine pregnancy; Morbid obesity (HCC); Indication for care in labor and delivery, antepartum; and Obesity affecting pregnancy on their problem list.  ----------------------------------------------------------------------------------- Patient reports Here with partner, feeling very tired d/t not sleeping well.  Was seen In triage this week for abdominal pain, was told her cervix was closed.  Had a BPP 8/8, MFM told her she should be induced between 39-40 weeks, Michelle Monroe does not want an induction.  Reviewed her concerns for a longer more painful labor.  Reviewed risks of carrying pregnancy past 40 and 41 weeks. Per note on 1/24 MFM states consider delivery between 39-40wks.  Encouraged Michelle Monroe to think about what she desires and revisit scheduling an induction at next visit.  Contractions: Not present. Vag. Bleeding: None.  Movement: Present. Leaking Fluid denies.  ----------------------------------------------------------------------------------- The following portions of the patient's history were reviewed and updated as appropriate: allergies, current medications, past family history, past medical history, past social history, past surgical history and problem list. Problem list updated.  Objective  Blood pressure 120/80, weight 274 lb (124.3 kg). Pregravid weight 250 lb (113.4 kg) Total Weight Gain 24 lb (10.9 kg) Urinalysis: Urine Protein    Urine Glucose    Fetal Status: Fetal Heart Rate (bpm): 130   Movement: Present  Presentation: Vertex  General:  Alert, oriented and cooperative. Patient is in no acute distress.  Skin: Skin is warm and dry. No rash noted.   Cardiovascular: Normal heart rate noted   Respiratory: Normal respiratory effort, no problems with respiration noted  Abdomen: Soft, gravid, appropriate for gestational age. Pain/Pressure: Absent     Pelvic:  Cervical exam deferred        Extremities: Normal range of motion.     Mental Status: Normal mood and affect. Normal behavior. Normal judgment and thought content.   Assessment   21 y.o. G1P0 at [redacted]w[redacted]d by  04/10/2021, by Ultrasound presenting for routine prenatal visit  Plan   July 2022 Problems (from 09/16/20 to present)     Problem Noted Resolved   Obesity affecting pregnancy 03/22/2021 by Tresea Mall, CNM No   Supervision of high-risk pregnancy 09/16/2020 by Natale Milch, MD No   Overview Addendum 11/25/2020  2:12 PM by Nadara Mustard, MD     Nursing Staff Provider  Office Location  [facility] Dating   7 wk [place]  Language  English Anatomy US  MFM nml  Flu Vaccine  Decl Genetic Screen  NIPS: normal xx  TDaP vaccine    Hgb A1C or  GTT Early : prediabetic 1hr: 102 Third trimester :   Covid unvaccinated   LAB RESULTS   Rhogam  Not needed Blood Type A/Positive/-- (07/01 1033)   Feeding Plan Unsure Antibody Negative (07/01 1033)  Contraception PILL Rubella 1.22 (07/01 1033)  Circumcision N/A RPR Non Reactive (07/01 1033)   Pediatrician   HBsAg Negative (07/01 1033)   Support Person  HIV Non Reactive (07/01 1033)  Prenatal Classes  Varicella Non immune    GBS  (For PCN allergy, check sensitivities)   BTL Consent NO    VBAC Consent N/A Pap  2022 NIL    Hgb Electro   Normal   Obesity in pregnancy pregravid BMI: 48  Trichomonas in pregnancy-  Treated, TOC neg 8/22           Term labor symptoms and general obstetric precautions including but not limited to vaginal bleeding, contractions, leaking of fluid and fetal movement were reviewed in detail with the patient. Please refer to After Visit Summary for other counseling recommendations.   Return in about 1 week (around 04/05/2021) for ROB.  Has BPP in 1  week  Michelle Monroe, [place], [facility]  03/29/21  1:35 PM

## 2021-04-05 ENCOUNTER — Ambulatory Visit: Payer: Medicaid Other | Admitting: *Deleted

## 2021-04-05 ENCOUNTER — Ambulatory Visit: Payer: Medicaid Other | Attending: Obstetrics

## 2021-04-05 ENCOUNTER — Encounter: Payer: Self-pay | Admitting: *Deleted

## 2021-04-05 ENCOUNTER — Other Ambulatory Visit: Payer: Self-pay | Admitting: *Deleted

## 2021-04-05 ENCOUNTER — Other Ambulatory Visit: Payer: Self-pay

## 2021-04-05 VITALS — BP 102/87 | HR 99

## 2021-04-05 DIAGNOSIS — O0993 Supervision of high risk pregnancy, unspecified, third trimester: Secondary | ICD-10-CM | POA: Insufficient documentation

## 2021-04-05 DIAGNOSIS — O99213 Obesity complicating pregnancy, third trimester: Secondary | ICD-10-CM | POA: Diagnosis not present

## 2021-04-05 DIAGNOSIS — E668 Other obesity: Secondary | ICD-10-CM

## 2021-04-05 DIAGNOSIS — Z3A39 39 weeks gestation of pregnancy: Secondary | ICD-10-CM

## 2021-04-06 ENCOUNTER — Encounter: Payer: Self-pay | Admitting: Licensed Practical Nurse

## 2021-04-06 ENCOUNTER — Ambulatory Visit (INDEPENDENT_AMBULATORY_CARE_PROVIDER_SITE_OTHER): Payer: Medicaid Other | Admitting: Licensed Practical Nurse

## 2021-04-06 VITALS — BP 118/74 | Wt 277.4 lb

## 2021-04-06 DIAGNOSIS — O99213 Obesity complicating pregnancy, third trimester: Secondary | ICD-10-CM

## 2021-04-06 DIAGNOSIS — G4733 Obstructive sleep apnea (adult) (pediatric): Secondary | ICD-10-CM | POA: Diagnosis not present

## 2021-04-06 DIAGNOSIS — O26893 Other specified pregnancy related conditions, third trimester: Secondary | ICD-10-CM

## 2021-04-06 DIAGNOSIS — O0990 Supervision of high risk pregnancy, unspecified, unspecified trimester: Secondary | ICD-10-CM

## 2021-04-06 DIAGNOSIS — O99353 Diseases of the nervous system complicating pregnancy, third trimester: Secondary | ICD-10-CM | POA: Diagnosis not present

## 2021-04-06 DIAGNOSIS — Z3A39 39 weeks gestation of pregnancy: Secondary | ICD-10-CM

## 2021-04-06 LAB — FETAL NONSTRESS TEST

## 2021-04-06 LAB — POCT URINALYSIS DIPSTICK OB
Glucose, UA: NEGATIVE
POC,PROTEIN,UA: NEGATIVE

## 2021-04-06 NOTE — Progress Notes (Signed)
Routine Prenatal Care Visit  Subjective  Michelle Monroe is a 21 y.o. G1P0 at [redacted]w[redacted]d being seen today for ongoing prenatal care.  She is currently monitored for the following issues for this high-risk pregnancy and has Supervision of high-risk pregnancy; OSA (obstructive sleep apnea); Intrauterine pregnancy; Morbid obesity (HCC); Indication for care in labor and delivery, antepartum; and Obesity affecting pregnancy on their problem list.  ----------------------------------------------------------------------------------- Patient reports no complaints.  Here with partner's sister. Flat affect today, reports did not sleep well.  Revisited recommendation for induction, Qamar does not want an induction-would prefer to wait for spontaneous labor.  Declines induction at 41 weeks. -Admits to having fears related to labor, afraid of the pain, does not not desire epidural but also worries she may not tolerate the pain.  Addressed fears related pain, recommend reading more about labor and pain management and spending item with her thoughts about what she wants labor to look like and what she thinks she may do during labor and consider the epidural if she needs it.  Contractions: Not present. Vag. Bleeding: None.  Movement: Present. Leaking Fluid denies.  ----------------------------------------------------------------------------------- The following portions of the patient's history were reviewed and updated as appropriate: allergies, current medications, past family history, past medical history, past social history, past surgical history and problem list. Problem list updated.  Objective  Blood pressure 118/74, weight 277 lb 6.4 oz (125.8 kg). Pregravid weight 250 lb (113.4 kg) Total Weight Gain 27 lb 6.4 oz (12.4 kg) Urinalysis: Urine Protein    Urine Glucose    Fetal Status: Fetal Heart Rate (bpm): RNST   Movement: Present  Presentation: Vertex  Baseline 140, moderate variability, pos accel, neg decel   General:  Alert, oriented and cooperative. Patient is in no acute distress.  Skin: Skin is warm and dry. No rash noted.   Cardiovascular: Normal heart rate noted  Respiratory: Normal respiratory effort, no problems with respiration noted  Abdomen: Soft, gravid, appropriate for gestational age. Pain/Pressure: Present     Pelvic:  Cervical exam performed Dilation: 1 Effacement (%): 50 Station: -3  Extremities: Normal range of motion.  Edema: None  Mental Status: Normal mood and affect. Normal behavior. Normal judgment and thought content.   Assessment   21 y.o. G1P0 at [redacted]w[redacted]d by  04/10/2021, by Ultrasound presenting for routine prenatal visit  RNST  Plan   July 2022 Problems (from 09/16/20 to present)     Problem Noted Resolved   Obesity affecting pregnancy 03/22/2021 by Tresea Mall, CNM No   Supervision of high-risk pregnancy 09/16/2020 by Natale Milch, MD No   Overview Addendum 11/25/2020  2:12 PM by Nadara Mustard, MD     Nursing Staff Provider  Office Location  [facility] Dating   7 wk [place]  Language  English Anatomy US  MFM nml  Flu Vaccine  Decl Genetic Screen  NIPS: normal xx  TDaP vaccine    Hgb A1C or  GTT Early : prediabetic 1hr: 102 Third trimester :   Covid unvaccinated   LAB RESULTS   Rhogam  Not needed Blood Type A/Positive/-- (07/01 1033)   Feeding Plan Unsure Antibody Negative (07/01 1033)  Contraception PILL Rubella 1.22 (07/01 1033)  Circumcision N/A RPR Non Reactive (07/01 1033)   Pediatrician   HBsAg Negative (07/01 1033)   Support Person  HIV Non Reactive (07/01 1033)  Prenatal Classes  Varicella Non immune    GBS  (For PCN allergy, check sensitivities)   BTL Consent NO  VBAC Consent N/A Pap  2022 NIL    Hgb Electro   Normal   Obesity in pregnancy pregravid BMI: 48  Trichomonas in pregnancy- Treated, TOC neg 8/22           Term labor symptoms and general obstetric precautions including but not limited to vaginal bleeding, contractions,  leaking of fluid and fetal movement were reviewed in detail with the patient. Please refer to After Visit Summary for other counseling recommendations.   Return next week for NST  Has fu with MFM on Tuesday   Roberto Scales, [place], [facility]  04/06/21  4:52 PM

## 2021-04-12 ENCOUNTER — Ambulatory Visit: Payer: Medicaid Other

## 2021-04-12 ENCOUNTER — Ambulatory Visit: Payer: Medicaid Other | Attending: Obstetrics

## 2021-04-13 ENCOUNTER — Telehealth: Payer: Self-pay | Admitting: Licensed Practical Nurse

## 2021-04-13 NOTE — Telephone Encounter (Signed)
TC to [facility] to fu on missed MFM visit. Orissa reports she had the baby on 2/11.  She was in [place] at the time labor started so she went to the nearest hospital.  Had an epidural. SVB on 2/11, baby girl 7lbs 11oz. Bottle feeding Will come to [facility] for PP care.  Carie Caddy, CNM  [place] [facility], [facility]  @TODAY @  11:06 AM

## 2021-04-15 ENCOUNTER — Encounter: Payer: Medicaid Other | Admitting: Licensed Practical Nurse

## 2023-03-12 IMAGING — US US MFM FETAL BPP W/O NON-STRESS
1 series · 12 of 27 positions shown · non-contrast
Comparison: none

[Series 1: us mfm fetal bpp w/o non-stress · 27 acquisitions, 12 frames shown]
[im 2/27]
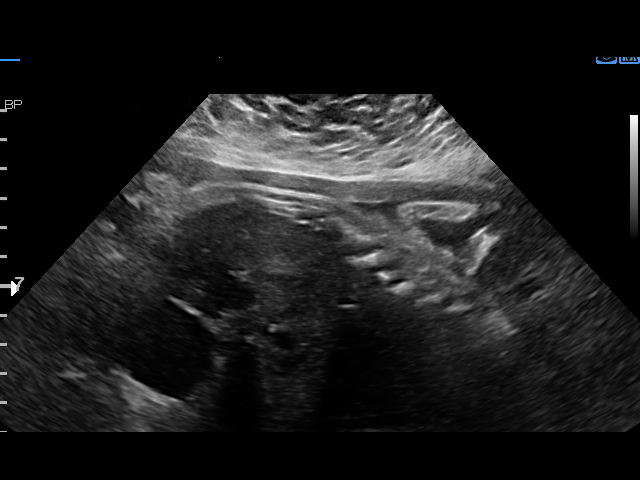
[im 4/27]
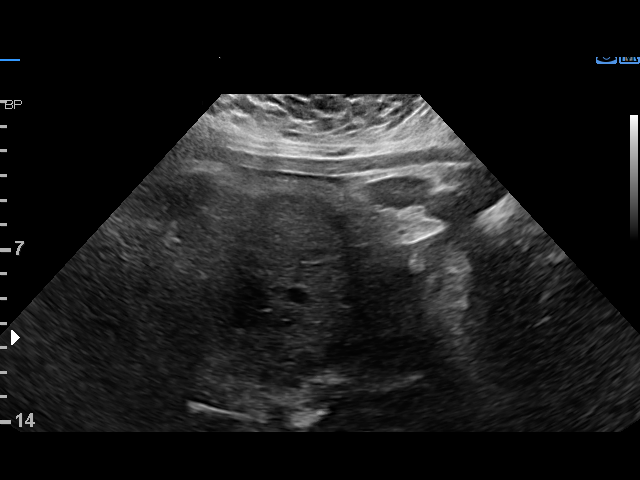
[im 6/27]
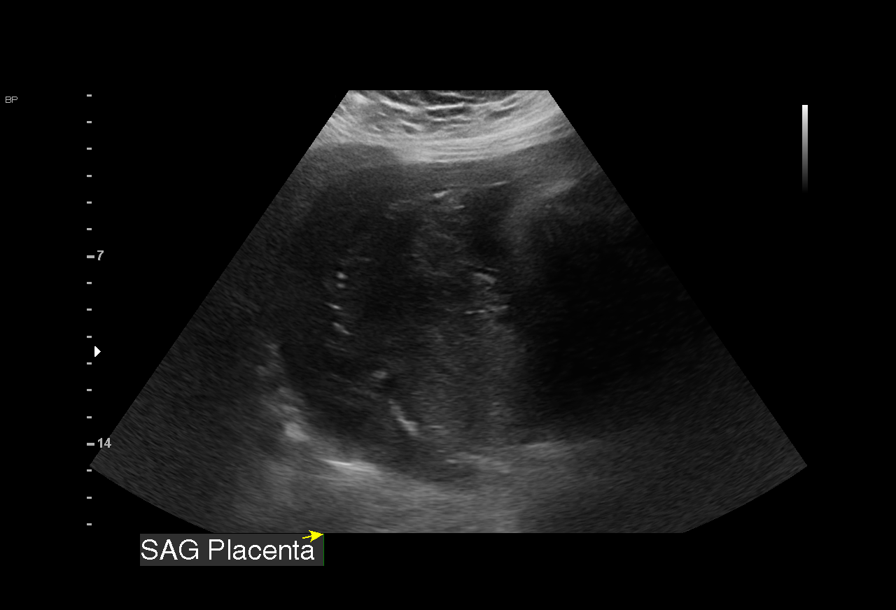
[im 8/27]
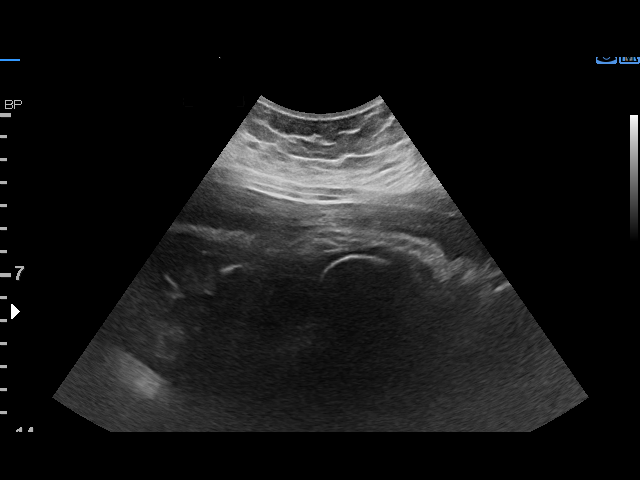
[im 11/27]
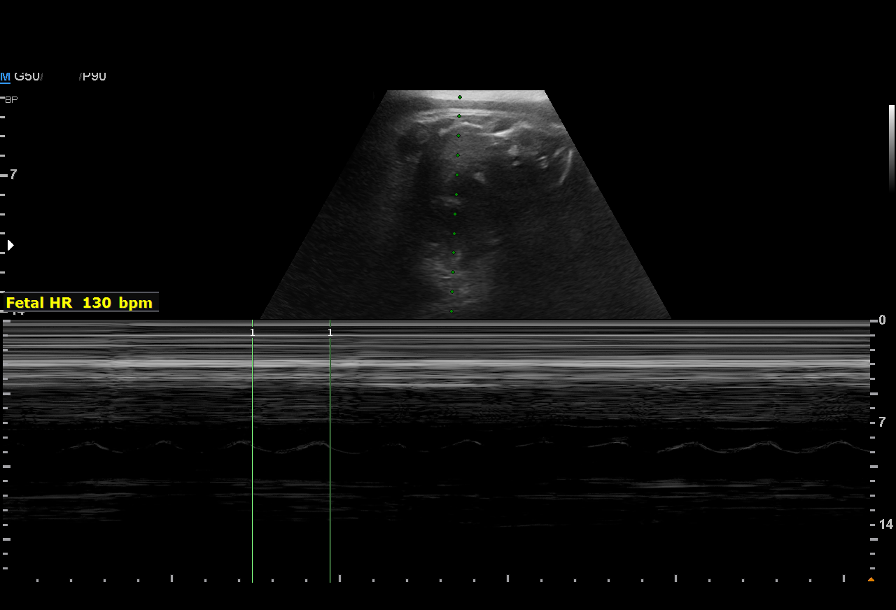
[im 13/27]
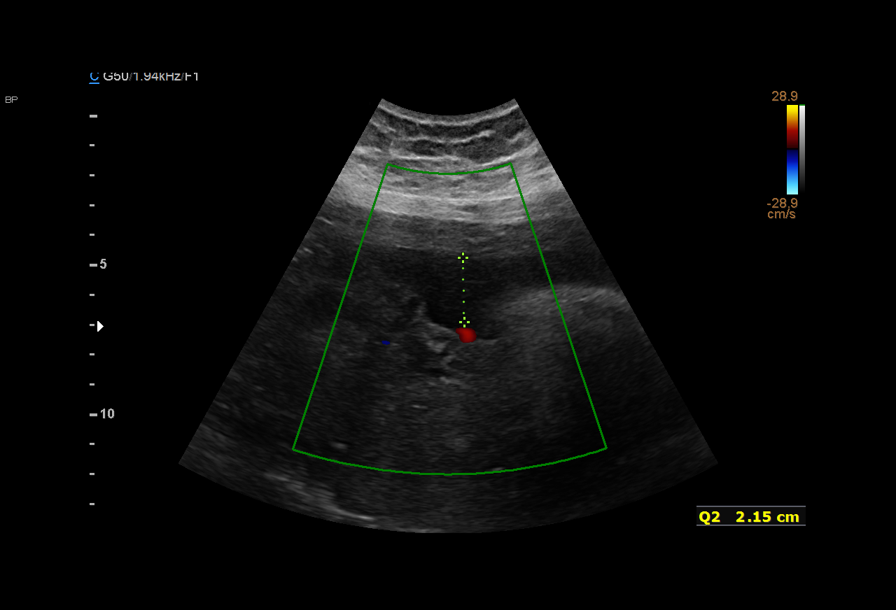
[im 15/27]
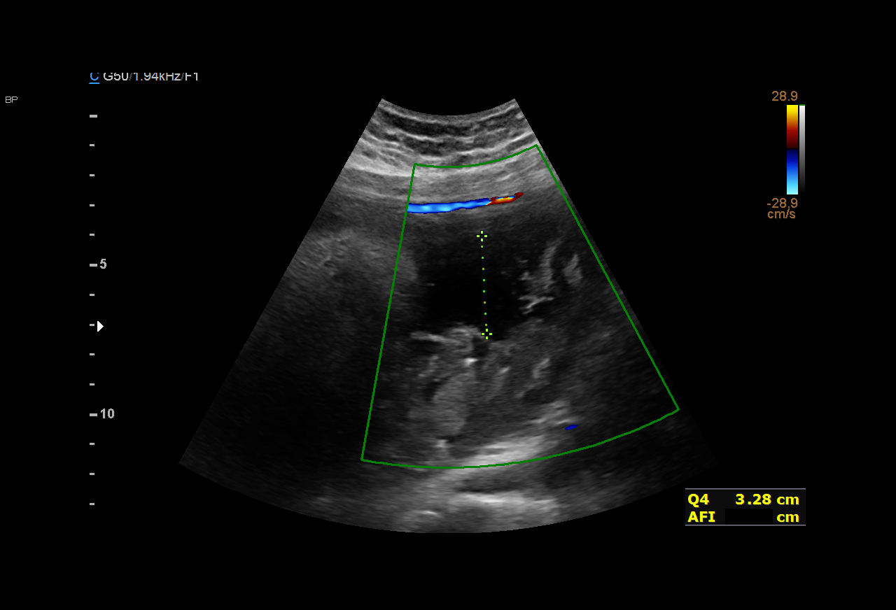
[im 17/27]
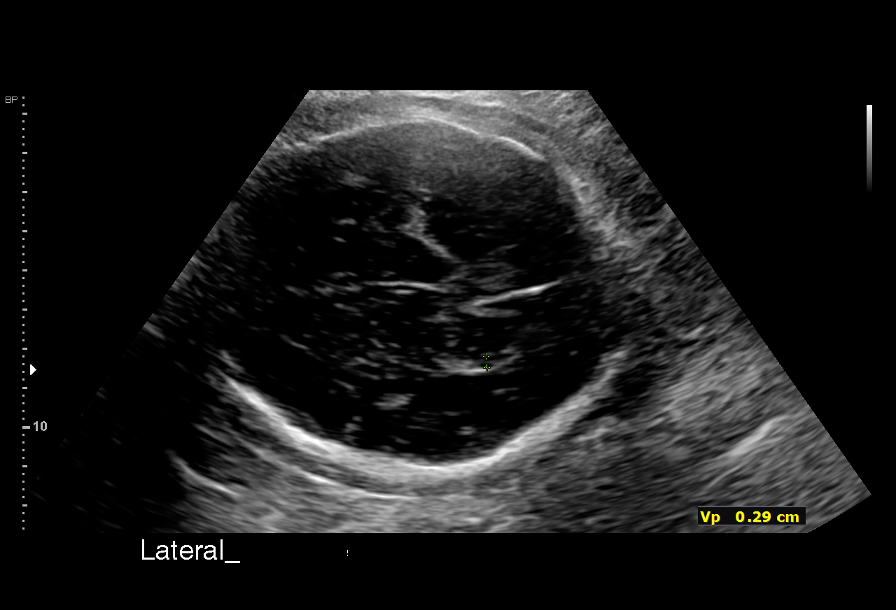
[im 20/27]
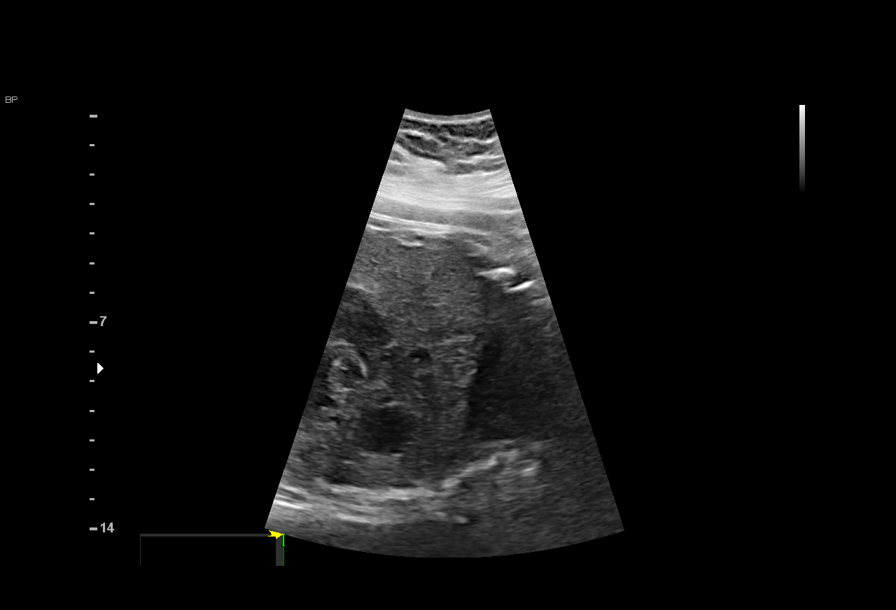
[im 22/27]
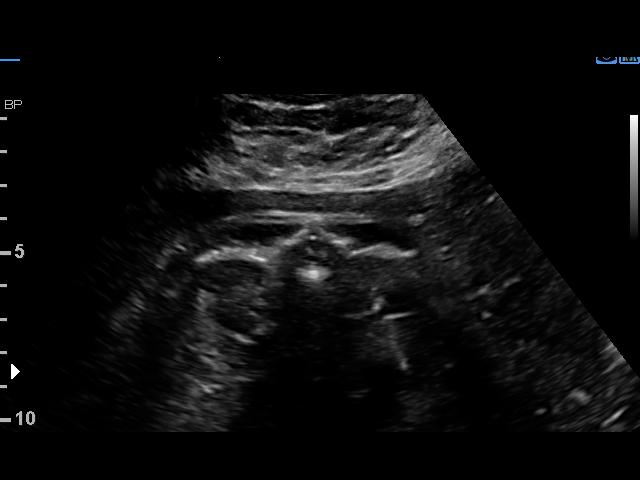
[im 24/27]
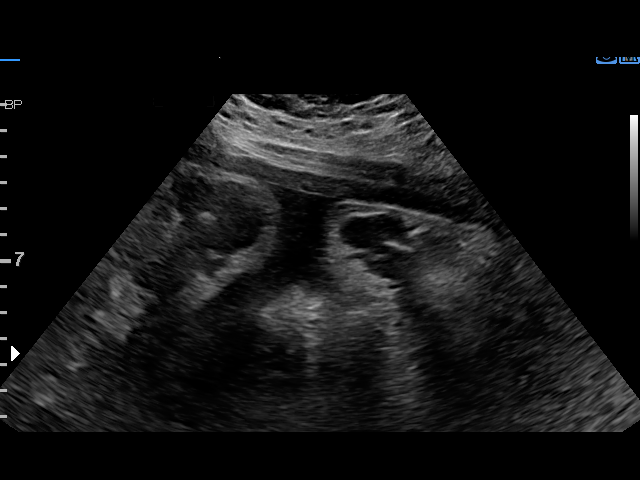
[im 26/27]
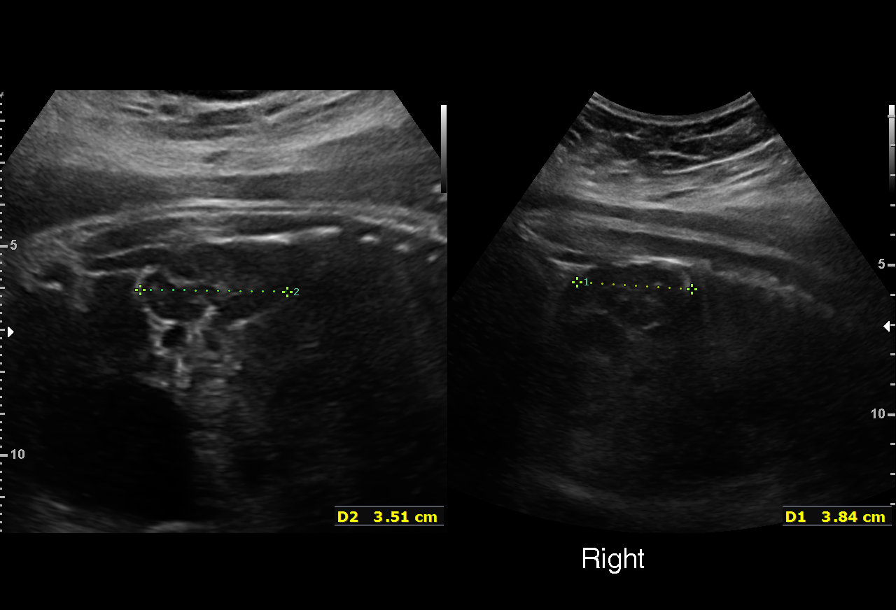

[12 of 27 positions shown; findings below may reference images not displayed]

BOILLAT

Indications

 Obesity complicating pregnancy, third
 trimester (BMI:48)
 Echogenic intracardiac focus of the heart
 (EIF)
 37 weeks gestation of pregnancy
 Low Risk NIPS
Fetal Evaluation

 Num Of Fetuses:         1
 Fetal Heart Rate(bpm):  130
 Cardiac Activity:       Observed
 Presentation:           Cephalic
 Placenta:               Posterior Fundal
 P. Cord Insertion:      Previously Visualized

 Amniotic Fluid
 AFI FV:      Within normal limits

 AFI Sum(cm)     %Tile       Largest Pocket(cm)
 11.21           33

 RUQ(cm)       RLQ(cm)       LUQ(cm)        LLQ(cm)

Biophysical Evaluation
 Amniotic F.V:   Pocket => 2 cm             F. Tone:        Observed
 F. Movement:    Observed                   Score:          [DATE]
 F. Breathing:   Observed
Biometry

 LV:        2.9  mm
OB History

 Gravidity:    1
 Living:       0
Gestational Age

 Best:          37w 2d     Det. By:  Early Ultrasound         EDD:   04/10/21
                                     (08/27/20)
Anatomy

 Cranium:               Appears normal         Diaphragm:              Appears normal
 Cavum:                 Appears normal         Kidneys:                Appear normal
 Ventricles:            Appears normal         Bladder:                Appears normal

 Other:  Technically difficult due to maternal habitus.
Cervix Uterus Adnexa

 Cervix
 Not visualized (advanced GA >06wks)

 Uterus
 Normal shape and size.

 Right Ovary
 Not visualized.

 Left Ovary
 Not visualized.

 Cul De Sac
 No free fluid seen.

 Adnexa
 No adnexal mass visualized.
Impression

 Antenatal testing performed given maternal elevated BMI
 The biophysical profile was [DATE] with good fetal movement and
 amniotic fluid volume.
Recommendations

 Continue weekly testing
 Consider repeat growth next week if not delivery
 Consider delivery between 39-40 weeks.
